# Patient Record
Sex: Female | Born: 1955 | Race: White | Hispanic: No | Marital: Married | Smoking: Never smoker
Health system: Southern US, Community
[De-identification: ages and names within clinical notes are randomized; demographics above are authoritative.]

## PROBLEM LIST (undated history)

## (undated) DIAGNOSIS — M1990 Unspecified osteoarthritis, unspecified site: Secondary | ICD-10-CM

## (undated) HISTORY — DX: Unspecified osteoarthritis, unspecified site: M19.90

---

## 1982-02-23 HISTORY — PX: DILATION AND CURETTAGE OF UTERUS: SHX78

## 1987-02-24 HISTORY — PX: TUBAL LIGATION: SHX77

## 1998-09-27 ENCOUNTER — Encounter: Payer: Self-pay | Admitting: Obstetrics and Gynecology

## 1998-09-27 ENCOUNTER — Ambulatory Visit (HOSPITAL_COMMUNITY): Admission: RE | Admit: 1998-09-27 | Discharge: 1998-09-27 | Payer: Self-pay | Admitting: Obstetrics and Gynecology

## 1999-09-29 ENCOUNTER — Encounter: Payer: Self-pay | Admitting: Obstetrics and Gynecology

## 1999-09-29 ENCOUNTER — Ambulatory Visit (HOSPITAL_COMMUNITY): Admission: RE | Admit: 1999-09-29 | Discharge: 1999-09-29 | Payer: Self-pay | Admitting: Obstetrics and Gynecology

## 2002-09-27 ENCOUNTER — Encounter: Admission: RE | Admit: 2002-09-27 | Discharge: 2002-09-27 | Payer: Self-pay | Admitting: Obstetrics and Gynecology

## 2002-09-27 ENCOUNTER — Encounter: Payer: Self-pay | Admitting: Obstetrics and Gynecology

## 2002-10-11 ENCOUNTER — Encounter: Admission: RE | Admit: 2002-10-11 | Discharge: 2002-10-11 | Payer: Self-pay | Admitting: Obstetrics and Gynecology

## 2002-10-11 ENCOUNTER — Encounter: Payer: Self-pay | Admitting: Obstetrics and Gynecology

## 2004-04-29 ENCOUNTER — Ambulatory Visit (HOSPITAL_COMMUNITY): Admission: RE | Admit: 2004-04-29 | Discharge: 2004-04-29 | Payer: Self-pay | Admitting: Family Medicine

## 2004-05-12 ENCOUNTER — Ambulatory Visit (HOSPITAL_COMMUNITY): Admission: RE | Admit: 2004-05-12 | Discharge: 2004-05-12 | Payer: Self-pay | Admitting: Family Medicine

## 2005-01-20 ENCOUNTER — Encounter: Admission: RE | Admit: 2005-01-20 | Discharge: 2005-01-20 | Payer: Self-pay | Admitting: Family Medicine

## 2005-05-04 ENCOUNTER — Encounter: Admission: RE | Admit: 2005-05-04 | Discharge: 2005-05-04 | Payer: Self-pay | Admitting: Family Medicine

## 2005-06-19 ENCOUNTER — Ambulatory Visit (HOSPITAL_COMMUNITY): Admission: RE | Admit: 2005-06-19 | Discharge: 2005-06-19 | Payer: Self-pay | Admitting: Obstetrics & Gynecology

## 2005-08-22 ENCOUNTER — Inpatient Hospital Stay (HOSPITAL_COMMUNITY): Admission: EM | Admit: 2005-08-22 | Discharge: 2005-08-23 | Payer: Self-pay | Admitting: *Deleted

## 2005-09-09 ENCOUNTER — Encounter: Admission: RE | Admit: 2005-09-09 | Discharge: 2005-09-09 | Payer: Self-pay | Admitting: Cardiology

## 2005-10-06 ENCOUNTER — Encounter: Admission: RE | Admit: 2005-10-06 | Discharge: 2005-10-06 | Payer: Self-pay | Admitting: Orthopaedic Surgery

## 2006-09-24 ENCOUNTER — Encounter: Admission: RE | Admit: 2006-09-24 | Discharge: 2006-09-24 | Payer: Self-pay | Admitting: Obstetrics and Gynecology

## 2007-04-06 IMAGING — US US ABDOMEN COMPLETE
1 series · 14 of 25 positions shown · non-contrast
Comparison: none

CLINICAL DATA: Abdominal discomfort.
 ABDOMEN ULTRASOUND:
TECHNIQUE: Complete abdominal ultrasound examination was performed including evaluation of the liver, gallbladder, bile ducts, pancreas, kidneys, spleen, IVC, and abdominal aorta.

[Series 1: us abdomen complete · 0.32mm/px · 14 of 69 slices shown]
[im 1/69]
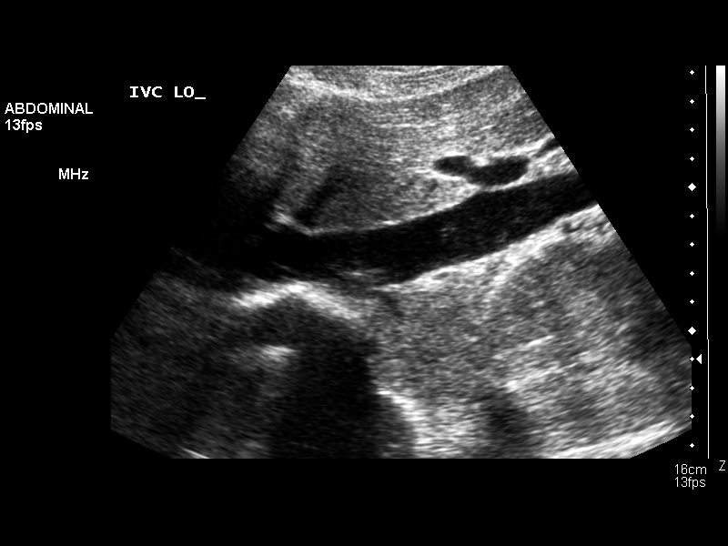
[im 6/69]
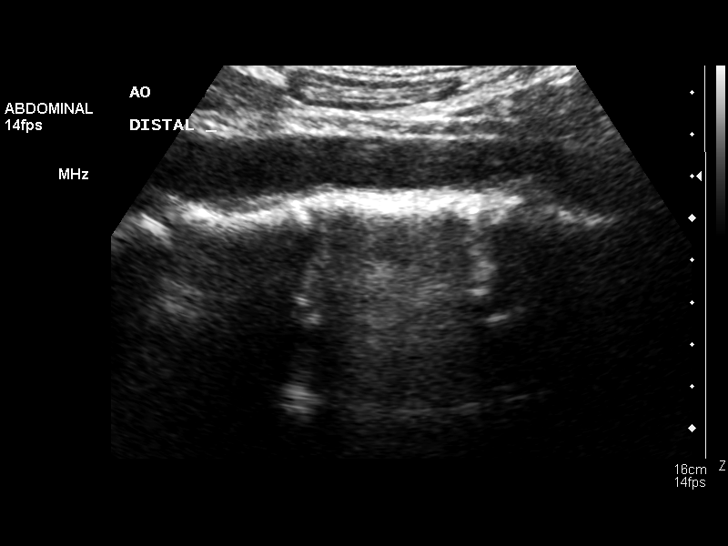
[im 12/69]
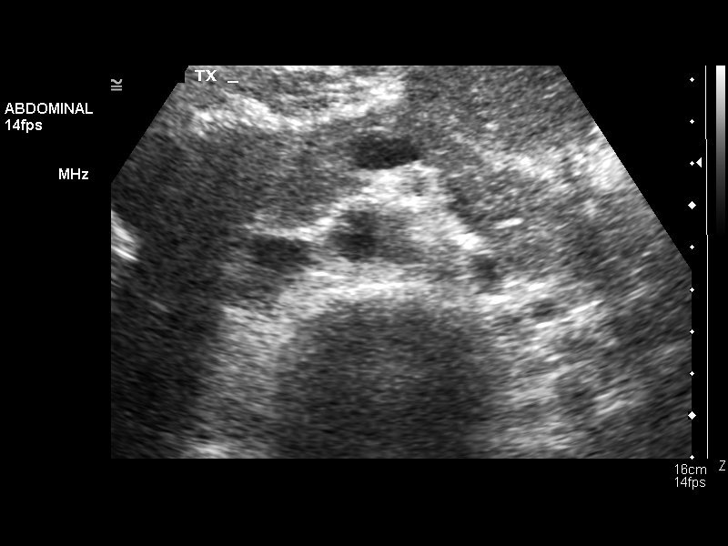
[im 18/69]
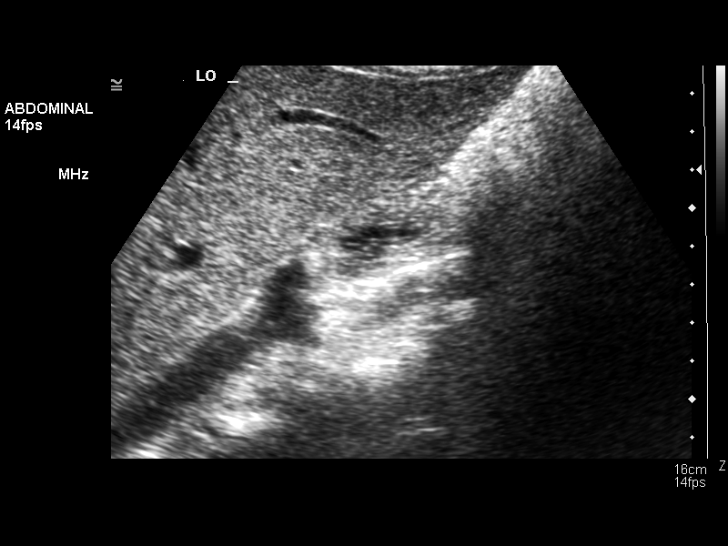
[im 23/69]
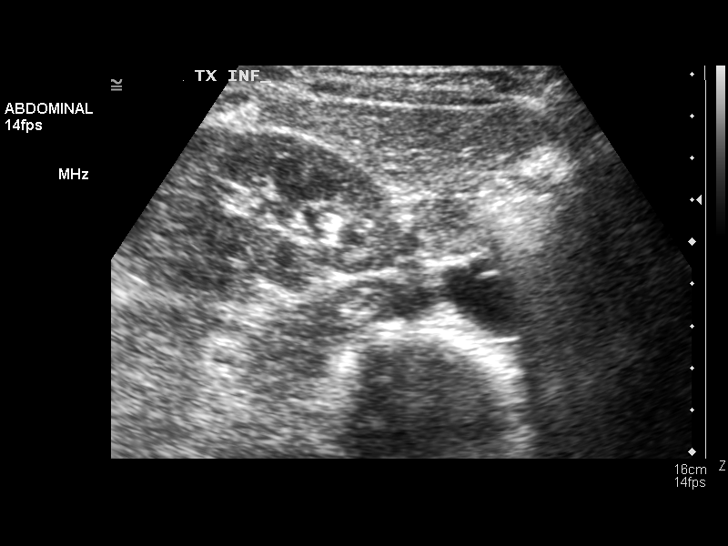
[im 26/69]
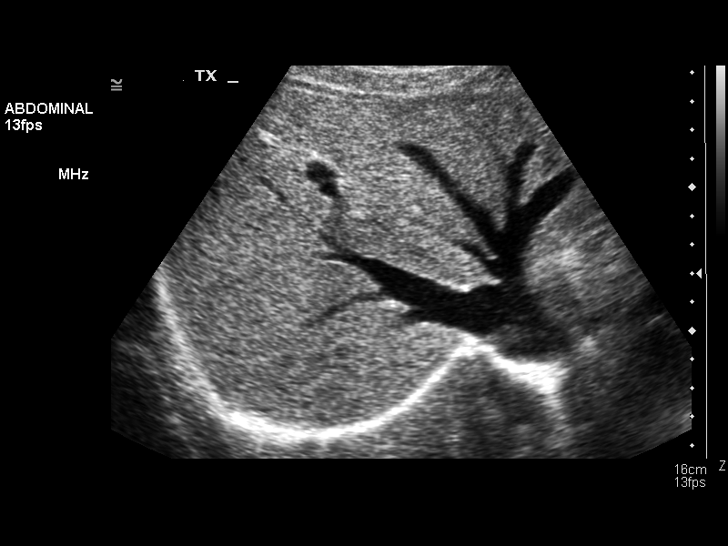
[im 32/69]
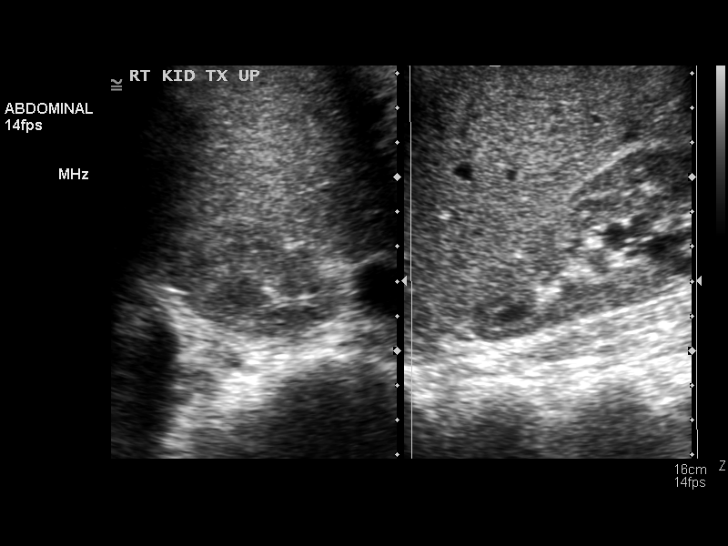
[im 37/69]
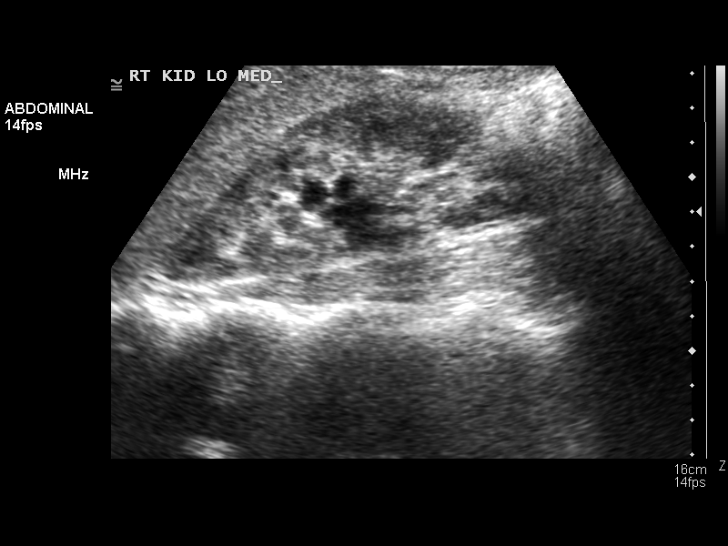
[im 43/69]
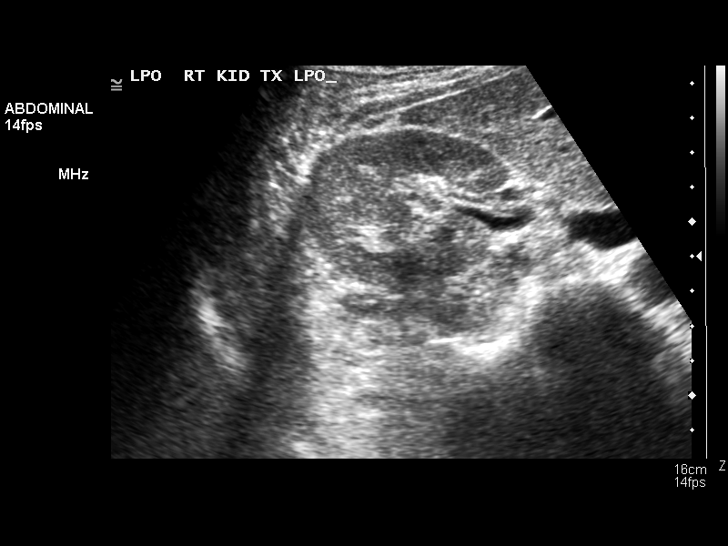
[im 46/69]
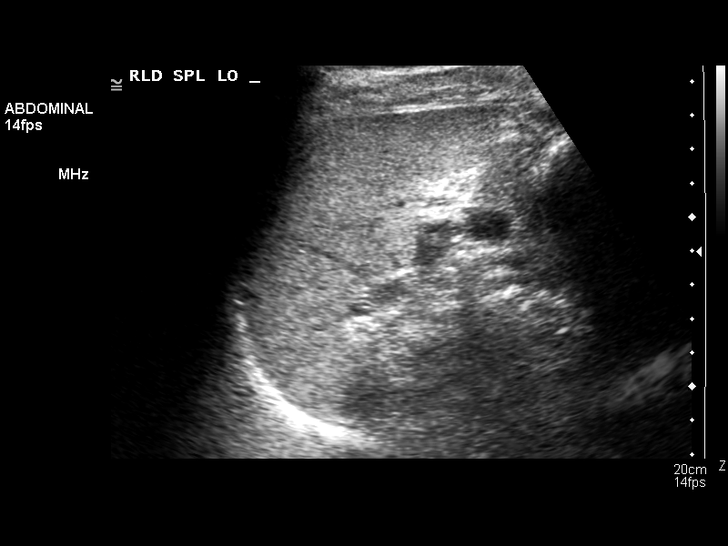
[im 52/69]
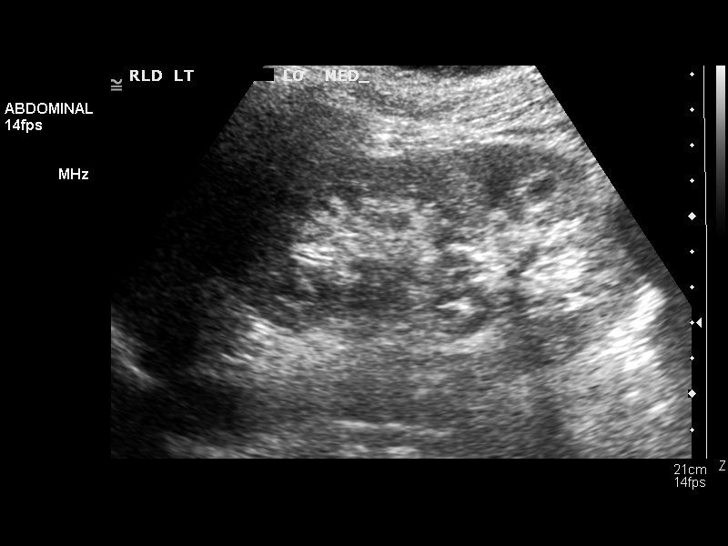
[im 57/69]
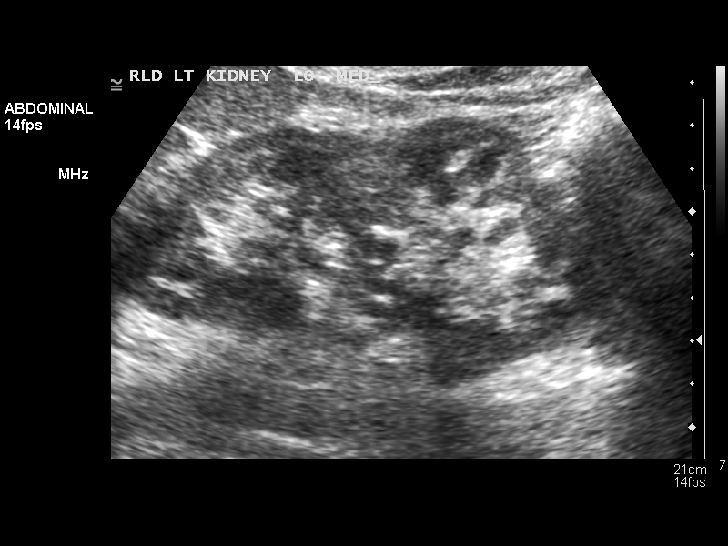
[im 63/69]
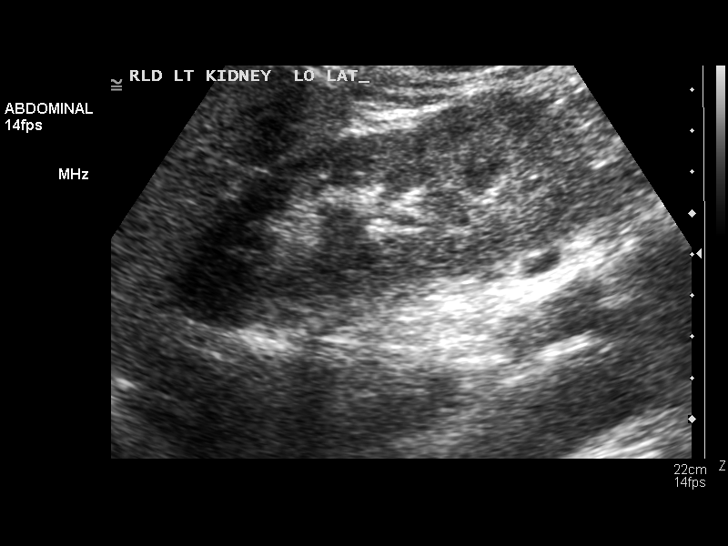
[im 69/69]
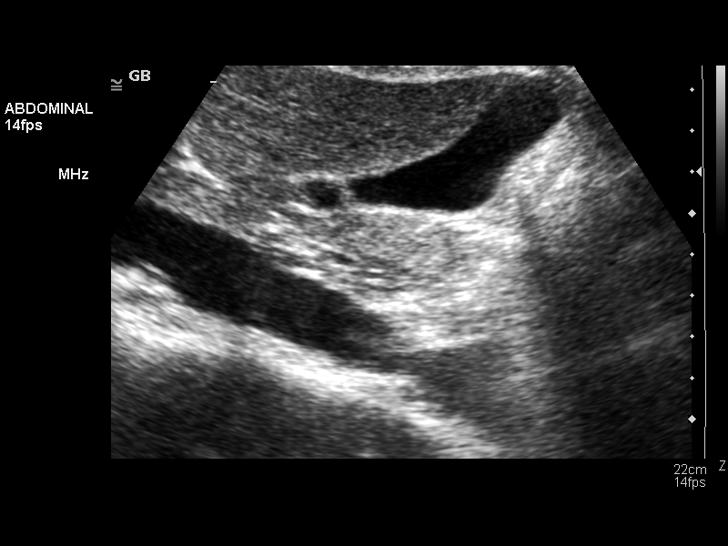

[14 of 25 positions shown; findings below may reference images not displayed]

FINDINGS: The gallbladder is well seen and no gallstones are noted.  The liver has a normal echogenic pattern.  Common bile duct is normal measuring 3.9 mm in diameter.  The IVC, pancreas, and spleen appear normal with only the most distal tail of the pancreas not well seen.  No hydronephrosis is noted.  The right kidney measures 12.1 cm sagittally with the left kidney measuring 11.8 cm.  The abdominal aorta is normal in caliber.
IMPRESSION: Negative abdominal ultrasound.  No gallstones.

## 2007-08-18 ENCOUNTER — Ambulatory Visit: Payer: Self-pay | Admitting: Internal Medicine

## 2007-09-06 ENCOUNTER — Ambulatory Visit: Payer: Self-pay | Admitting: Internal Medicine

## 2007-12-28 ENCOUNTER — Encounter: Admission: RE | Admit: 2007-12-28 | Discharge: 2007-12-28 | Payer: Self-pay | Admitting: Obstetrics and Gynecology

## 2009-02-26 ENCOUNTER — Encounter: Admission: RE | Admit: 2009-02-26 | Discharge: 2009-02-26 | Payer: Self-pay | Admitting: Obstetrics and Gynecology

## 2010-06-05 ENCOUNTER — Other Ambulatory Visit: Payer: Self-pay | Admitting: Obstetrics and Gynecology

## 2010-06-05 DIAGNOSIS — Z1231 Encounter for screening mammogram for malignant neoplasm of breast: Secondary | ICD-10-CM

## 2010-06-05 DIAGNOSIS — Z780 Asymptomatic menopausal state: Secondary | ICD-10-CM

## 2010-06-11 ENCOUNTER — Ambulatory Visit: Payer: Self-pay

## 2010-06-11 ENCOUNTER — Other Ambulatory Visit: Payer: Self-pay

## 2010-06-18 ENCOUNTER — Ambulatory Visit
Admission: RE | Admit: 2010-06-18 | Discharge: 2010-06-18 | Disposition: A | Discharge status: Home / Self Care | Payer: BC Managed Care – PPO | Source: Ambulatory Visit | Attending: Obstetrics and Gynecology | Admitting: Obstetrics and Gynecology

## 2010-06-18 DIAGNOSIS — Z1231 Encounter for screening mammogram for malignant neoplasm of breast: Secondary | ICD-10-CM

## 2010-06-18 DIAGNOSIS — Z780 Asymptomatic menopausal state: Secondary | ICD-10-CM

## 2010-07-11 NOTE — Discharge Summary (Signed)
Annette Stephens, Annette Stephens             ACCOUNT NO.:  [id number]   MEDICAL RECORD NO.:  [id number]          PATIENT TYPE:  INP   LOCATION:  3729                         FACILITY:  [facility]   PHYSICIAN:  Colleen Can. Deborah Chalk, M.D.DATE OF BIRTH:  [birth year]   DATE OF ADMISSION:  DATE OF DISCHARGE:  08/23/2005                                 DISCHARGE SUMMARY   PRIMARY DISCHARGE DIAGNOSIS:  Atypical chest pain with negative cardiac  enzymes.   HISTORY OF PRESENT ILLNESS:  The patient is a very pleasant 55 year old  female who presented to the Emergency Department on 08/22/2005 with an  episode of chest pressure that was somewhat intermittent.  It responded  promptly to nitroglycerin.  She had no other associated symptoms.  She was  subsequently seen in the Emergency Room and was admitted for overnight  evaluation.   Please see the dictated History and Physical for further patient  presentation and profile.   LABORATORY DATA:  CBC normal.  Chemistries were normal.  Total cholesterol  193, LDL 105, HDL 82 and triglycerides 29.  EKG was unremarkable.  All  cardiac enzymes were negative.   HOSPITAL COURSE:  The patient was admitted electively.  She was watched  overnight on telemetry. She was placed on subcutaneous Lovenox. She ruled  out, negative for myocardial infarction and had no recurrence of chest pain.  Today on August 23, 2005 she is doing well without complaints and is felt to be  a satisfactory candidate for discharge.   DISCHARGE CONDITION:  Stable.   DISCHARGE MEDICATIONS:  1.  Aspirin daily.  2.  Protonix 40 mg daily.  3.  Nitroglycerin p.r.n.   DISCHARGE INSTRUCTIONS:  Her activity is to be as tolerated.   PLAN:  Will plan on setting her up for an outpatient stress Cardiolite.  Our  office will be calling her with full instructions on Monday morning, August 24, 2005.  She is to call if any problems arise in the interim.      Sharlee Blew, N.P.      Colleen Can. Deborah Chalk,  M.D.  Electronically Signed    LC/MEDQ  D:  08/23/2005  T:  08/23/2005  Job:  [id number]   cc:   Teena Irani. Arlyce Dice, M.D.  Fax: [phone]

## 2010-07-11 NOTE — H&P (Signed)
Annette Stephens, Annette Stephens             ACCOUNT NO.:  [id number]   MEDICAL RECORD NO.:  [id number]          PATIENT TYPE:  EMS   LOCATION:  MINO                         FACILITY:  [facility]   PHYSICIAN:  Colleen Can. Deborah Chalk, M.D.DATE OF BIRTH:  February 07, 1956   DATE OF ADMISSION:  08/22/2005  DATE OF DISCHARGE:                                HISTORY & PHYSICAL   CHIEF COMPLAINT:  Chest pain.   HISTORY OF PRESENT ILLNESS:  The patient is a very pleasant, 55 year old,  who really has no significant past medical history.  She presents to the  emergency department today per EMS with an episode of chest pressure.  She  had been out walking about 45 minutes earlier this morning without problems.  At around 10 a.m., she was out on her deck watering her flowers when she had  the onset of chest pressure that was located substernally.  There were no  other associated symptoms.  No radiation, no nausea, vomiting, or shortness  of breath.  She called EMS within 10 minutes after the onset.  She noticed  the pain was more like a pressure sensation as if someone were pressing into  her chest and then letting up.  She was given nitroglycerin and aspirin in  route with complete relief.  Her EKG was unremarkable.  Her blood pressure  at the time the fire department arrived was 150 systolic, which is unusual.  She is currently pain free.   PAST MEDICAL HISTORY:  1.  Past history of an abnormal EKG found on physical exam with a negative      stress echocardiogram in February of 2005.  2.  Status post tubal ligation.   She has had no other surgeries and no other health problems.  Her  cholesterol levels are unknown.   ALLERGIES:  None.   MEDICATIONS:  None.   FAMILY HISTORY:  Her father is alive at age 44.  He has had previous heart  attacks as well as peripheral vascular disease starting in his early 28s.  Her mother is alive at 49 with hypertension.  She has one brother, who is an  alcoholic as well as a  smoker, who had a heart attack in his early 4s.  She  has two sisters.   SOCIAL HISTORY:  She is married, she has two children, she has no tobacco  use and very rare alcohol use.   REVIEW OF SYSTEMS:  She had previously been doing well without complaints.  She attended a wedding last night, had one glass of red wine.  She did fall  just a couple of days ago and sprained her left wrist.  She does exercise  regularly.  She has had no previous episodes of chest pain or shortness of  breath.  She has had no problems with lightheadedness, dizziness, fever,  flu, cough, indigestion, constipation, diarrhea.   PHYSICAL EXAMINATION:  GENERAL:  She is in no acute distress.  She is pain  free.  VITAL SIGNS:  Blood pressure is 110/72, heart rate 77, respirations are 20,  she is afebrile.  SKIN:  Warm and dry,  and color is unremarkable.  LUNGS:  Clear.  HEART:  Regular rhythm.  ABDOMEN:  Soft with positive bowel sounds, nontender.  EXTREMITIES:  Without edema.  She does have a splint to the left wrist.  NEUROLOGIC:  Intact, and there are no gross focal deficits.   PERTINENT LABORATORY DATA:  Her chemistries are normal, CBC is normal,  cardiac markers are negative x2, EKG is negative.   FINAL IMPRESSION:  1.  Chest pain that was responsive to nitroglycerin.  2.  Positive family history.  3.  Unknown cholesterol level.   PLAN:  She will be admitted for overnight, will check serial labs.  Probably  if enzymes and EKG remain unremarkable, we will set her up for a repeat  stress echo as an outpatient.      Sharlee Blew, N.P.    ______________________________  Colleen Can. Deborah Chalk, M.D.    LC/MEDQ  D:  08/22/2005  T:  08/22/2005  Job:  [id number]   cc:   Teena Irani. Arlyce Dice, M.D.  Fax: [phone]

## 2011-09-21 ENCOUNTER — Other Ambulatory Visit: Payer: Self-pay | Admitting: Family Medicine

## 2011-09-21 DIAGNOSIS — Z1231 Encounter for screening mammogram for malignant neoplasm of breast: Secondary | ICD-10-CM

## 2011-09-23 ENCOUNTER — Ambulatory Visit
Admission: RE | Admit: 2011-09-23 | Discharge: 2011-09-23 | Disposition: A | Discharge status: Home / Self Care | Payer: BC Managed Care – PPO | Source: Ambulatory Visit | Attending: Family Medicine | Admitting: Family Medicine

## 2011-09-23 DIAGNOSIS — Z1231 Encounter for screening mammogram for malignant neoplasm of breast: Secondary | ICD-10-CM

## 2012-06-22 ENCOUNTER — Ambulatory Visit: Payer: BC Managed Care – PPO | Admitting: Family Medicine

## 2012-12-19 ENCOUNTER — Other Ambulatory Visit: Payer: Self-pay

## 2012-12-19 DIAGNOSIS — Z1231 Encounter for screening mammogram for malignant neoplasm of breast: Secondary | ICD-10-CM

## 2013-01-16 ENCOUNTER — Ambulatory Visit
Admission: RE | Admit: 2013-01-16 | Discharge: 2013-01-16 | Disposition: A | Discharge status: Home / Self Care | Payer: BC Managed Care – PPO | Source: Ambulatory Visit

## 2013-01-16 DIAGNOSIS — Z1231 Encounter for screening mammogram for malignant neoplasm of breast: Secondary | ICD-10-CM

## 2013-12-19 ENCOUNTER — Other Ambulatory Visit: Payer: Self-pay | Admitting: Family Medicine

## 2013-12-19 DIAGNOSIS — Z1231 Encounter for screening mammogram for malignant neoplasm of breast: Secondary | ICD-10-CM

## 2014-01-22 ENCOUNTER — Ambulatory Visit: Payer: BC Managed Care – PPO

## 2014-01-22 ENCOUNTER — Ambulatory Visit
Admission: RE | Admit: 2014-01-22 | Discharge: 2014-01-22 | Disposition: A | Discharge status: Home / Self Care | Payer: BC Managed Care – PPO | Source: Ambulatory Visit | Attending: Family Medicine | Admitting: Family Medicine

## 2014-01-22 DIAGNOSIS — Z1231 Encounter for screening mammogram for malignant neoplasm of breast: Secondary | ICD-10-CM

## 2014-12-19 ENCOUNTER — Other Ambulatory Visit: Payer: Self-pay

## 2014-12-19 DIAGNOSIS — Z1231 Encounter for screening mammogram for malignant neoplasm of breast: Secondary | ICD-10-CM

## 2015-01-25 ENCOUNTER — Ambulatory Visit
Admission: RE | Admit: 2015-01-25 | Discharge: 2015-01-25 | Disposition: A | Discharge status: Home / Self Care | Payer: BC Managed Care – PPO | Source: Ambulatory Visit

## 2015-01-25 DIAGNOSIS — Z1231 Encounter for screening mammogram for malignant neoplasm of breast: Secondary | ICD-10-CM

## 2015-05-14 ENCOUNTER — Encounter: Payer: Self-pay | Admitting: Internal Medicine

## 2016-02-13 ENCOUNTER — Other Ambulatory Visit: Payer: Self-pay | Admitting: Family Medicine

## 2016-02-13 DIAGNOSIS — Z1231 Encounter for screening mammogram for malignant neoplasm of breast: Secondary | ICD-10-CM

## 2016-03-13 ENCOUNTER — Ambulatory Visit
Admission: RE | Admit: 2016-03-13 | Discharge: 2016-03-13 | Disposition: A | Discharge status: Home / Self Care | Payer: BC Managed Care – PPO | Source: Ambulatory Visit | Attending: Family Medicine | Admitting: Family Medicine

## 2016-03-13 DIAGNOSIS — Z1231 Encounter for screening mammogram for malignant neoplasm of breast: Secondary | ICD-10-CM

## 2016-07-15 ENCOUNTER — Ambulatory Visit (INDEPENDENT_AMBULATORY_CARE_PROVIDER_SITE_OTHER): Payer: BC Managed Care – PPO | Admitting: Orthopaedic Surgery

## 2016-07-15 ENCOUNTER — Encounter (INDEPENDENT_AMBULATORY_CARE_PROVIDER_SITE_OTHER): Payer: Self-pay

## 2016-07-15 DIAGNOSIS — M67449 Ganglion, unspecified hand: Secondary | ICD-10-CM

## 2016-07-15 MED ORDER — DICLOFENAC SODIUM 1 % TD GEL: 2.0000 g | Freq: Four times a day (QID) | TRANSDERMAL | 3 refills | Status: DC

## 2016-07-15 NOTE — Progress Notes (Signed)
The patient is well-known to me. She comes in today with new chief complaint of pain in the cyst along her right hand middle finger. She points over the DIP joint. New Parafon exam she does have findings consistent with a mucous cyst. DIP joint moves well but she can feel a dorsal bone spur as well. There is cystic changes on the soft tissue. I was able to clean sterile Betadine and alcohol and with a small gauge needle I was able to aspirate some gelatinous material from this. I will Have Her Pl., Neosporin on a daily. I've told her the other treatment would be actually an open operation to remove the bone spur. This is bad enough she said she will let us know. She'll otherwise follow up as needed.

## 2017-03-16 ENCOUNTER — Other Ambulatory Visit: Payer: Self-pay | Admitting: Family Medicine

## 2017-03-16 DIAGNOSIS — Z139 Encounter for screening, unspecified: Secondary | ICD-10-CM

## 2017-04-06 ENCOUNTER — Ambulatory Visit
Admission: RE | Admit: 2017-04-06 | Discharge: 2017-04-06 | Disposition: A | Discharge status: Home / Self Care | Payer: BC Managed Care – PPO | Source: Ambulatory Visit | Attending: Family Medicine | Admitting: Family Medicine

## 2017-04-06 DIAGNOSIS — Z139 Encounter for screening, unspecified: Secondary | ICD-10-CM

## 2017-07-13 ENCOUNTER — Encounter: Payer: Self-pay | Admitting: Gastroenterology

## 2017-07-14 ENCOUNTER — Other Ambulatory Visit: Payer: Self-pay | Admitting: Family Medicine

## 2017-07-14 DIAGNOSIS — E2839 Other primary ovarian failure: Secondary | ICD-10-CM

## 2017-08-03 ENCOUNTER — Other Ambulatory Visit: Payer: Self-pay | Admitting: Family Medicine

## 2017-08-03 DIAGNOSIS — R1031 Right lower quadrant pain: Secondary | ICD-10-CM

## 2017-08-06 ENCOUNTER — Ambulatory Visit
Admission: RE | Admit: 2017-08-06 | Discharge: 2017-08-06 | Disposition: A | Discharge status: Home / Self Care | Payer: BC Managed Care – PPO | Source: Ambulatory Visit | Attending: Family Medicine | Admitting: Family Medicine

## 2017-08-06 DIAGNOSIS — R1031 Right lower quadrant pain: Secondary | ICD-10-CM

## 2017-08-10 ENCOUNTER — Ambulatory Visit (INDEPENDENT_AMBULATORY_CARE_PROVIDER_SITE_OTHER): Payer: Self-pay

## 2017-08-10 ENCOUNTER — Ambulatory Visit (INDEPENDENT_AMBULATORY_CARE_PROVIDER_SITE_OTHER): Payer: BC Managed Care – PPO | Admitting: Orthopaedic Surgery

## 2017-08-10 DIAGNOSIS — M545 Low back pain, unspecified: Secondary | ICD-10-CM

## 2017-08-10 MED ORDER — MELOXICAM 7.5 MG PO TABS: 15.0000 mg | ORAL_TABLET | Freq: Every day | ORAL | 2 refills | Status: DC | PRN

## 2017-08-10 MED ORDER — CYCLOBENZAPRINE HCL 5 MG PO TABS: 5.0000 mg | ORAL_TABLET | Freq: Three times a day (TID) | ORAL | 3 refills | Status: DC | PRN

## 2017-08-10 MED ORDER — METHOCARBAMOL 500 MG PO TABS: 500.0000 mg | ORAL_TABLET | Freq: Four times a day (QID) | ORAL | 2 refills | Status: DC | PRN

## 2017-08-10 NOTE — Progress Notes (Signed)
Office Visit Note   Patient: Annette Stephens           Date of Birth: 1955-07-11           MRN: [id number] Visit Date: 08/10/2017              Requested by: Richmond CampbellKaplan, Kristen W., PA-C [address] [place], [place] [id number] PCP: Richmond CampbellKaplan, Kristen W., PA-C   Assessment & Plan: Visit Diagnoses:  1. Acute midline low back pain without sciatica     Plan: Impression is low back pain and muscle strain.  Recommend physical therapy, Robaxin, meloxicam, Flexeril as needed.  Patient instructed to notify us if she does not see any improvement so that we could obtain it.  Otherwise follow-up as needed.  Follow-Up Instructions: Return if symptoms worsen or fail to improve.   Orders:  Orders Placed This Encounter  Procedures  . XR Lumbar Spine 2-3 Views   Meds ordered this encounter  Medications  . methocarbamol (ROBAXIN) 500 MG tablet    Sig: Take 1 tablet (500 mg total) by mouth every 6 (six) hours as needed for muscle spasms.    Dispense:  30 tablet    Refill:  2    Try this muscle relaxer first  . meloxicam (MOBIC) 7.5 MG tablet    Sig: Take 2 tablets (15 mg total) by mouth daily as needed for pain.    Dispense:  30 tablet    Refill:  2  . cyclobenzaprine (FLEXERIL) 5 MG tablet    Sig: Take 1-2 tablets (5-10 mg total) by mouth 3 (three) times daily as needed for muscle spasms.    Dispense:  30 tablet    Refill:  3      Procedures: No procedures performed   Clinical Data: No additional findings.   Subjective: Chief Complaint  Patient presents with  . Lower Back - Pain    Annette Stephens is a very pleasant 62 year old female comes in with 2-week history of low back pain without any radicular symptoms.  Denies any shortness.  Denies any constitutional symptoms.  Denies any injuries.  She has taken gabapentin and tizanidine without any relief.   Review of Systems  Constitutional: Negative.   HENT: Negative.   Eyes: Negative.   Respiratory: Negative.   Cardiovascular:  Negative.   Endocrine: Negative.   Musculoskeletal: Negative.   Neurological: Negative.   Hematological: Negative.   Psychiatric/Behavioral: Negative.   All other systems reviewed and are negative.    Objective: Vital Signs: There were no vitals taken for this visit.  Physical Exam  Constitutional: She is oriented to person, place, and time. She appears well-developed and well-nourished.  HENT:  Head: Normocephalic and atraumatic.  Eyes: EOM are normal.  Neck: Neck supple.  Pulmonary/Chest: Effort normal.  Abdominal: Soft.  Neurological: She is alert and oriented to person, place, and time.  Skin: Skin is warm. Capillary refill takes less than 2 seconds.  Psychiatric: She has a normal mood and affect. Her behavior is normal. Judgment and thought content normal.  Nursing note and vitals reviewed.   Ortho Exam Low back exam shows no palpable defects.  Negative straight leg.  Hip exam asymptomatic.  Normal reflexes. Specialty Comments:  No specialty comments available.  Imaging: Xr Lumbar Spine 2-3 Views  Result Date: 08/10/2017 Mild lumbar spondylosis.  No significant degenerative disc disease.Normal lumbar lordosis.      PMFS History: Patient Active Problem List   Diagnosis Date Noted  . Mucous cyst  of finger 07/15/2016   No past medical history on file.  No family history on file.   Social History   Occupational History  . Not on file  Tobacco Use  . Smoking status: Not on file  Substance and Sexual Activity  . Alcohol use: Not on file  . Drug use: Not on file  . Sexual activity: Not on file

## 2017-08-12 ENCOUNTER — Encounter: Payer: Self-pay | Admitting: Gastroenterology

## 2017-08-12 NOTE — Addendum Note (Signed)
Addended by: Albertina ParrGARCIA, Earley Grobe on: 08/12/2017 08:39 AM   Modules accepted: Orders

## 2017-08-23 ENCOUNTER — Other Ambulatory Visit: Payer: Self-pay | Admitting: Family Medicine

## 2017-08-23 DIAGNOSIS — R102 Pelvic and perineal pain: Secondary | ICD-10-CM

## 2017-08-27 ENCOUNTER — Ambulatory Visit
Admission: RE | Admit: 2017-08-27 | Discharge: 2017-08-27 | Disposition: A | Discharge status: Home / Self Care | Payer: BC Managed Care – PPO | Source: Ambulatory Visit | Attending: Family Medicine | Admitting: Family Medicine

## 2017-08-27 DIAGNOSIS — E2839 Other primary ovarian failure: Secondary | ICD-10-CM

## 2017-08-31 ENCOUNTER — Ambulatory Visit
Admission: RE | Admit: 2017-08-31 | Discharge: 2017-08-31 | Disposition: A | Discharge status: Home / Self Care | Payer: BC Managed Care – PPO | Source: Ambulatory Visit | Attending: Family Medicine | Admitting: Family Medicine

## 2017-08-31 DIAGNOSIS — R102 Pelvic and perineal pain: Secondary | ICD-10-CM

## 2017-10-07 ENCOUNTER — Encounter: Payer: BC Managed Care – PPO | Admitting: Gastroenterology

## 2017-12-15 ENCOUNTER — Encounter: Payer: Self-pay | Admitting: Gastroenterology

## 2018-01-10 ENCOUNTER — Encounter: Payer: BC Managed Care – PPO | Admitting: Gastroenterology

## 2018-01-25 ENCOUNTER — Ambulatory Visit (AMBULATORY_SURGERY_CENTER): Payer: Self-pay

## 2018-01-25 ENCOUNTER — Other Ambulatory Visit: Payer: Self-pay

## 2018-01-25 VITALS — Ht 62.0 in | Wt 127.8 lb

## 2018-01-25 DIAGNOSIS — Z1211 Encounter for screening for malignant neoplasm of colon: Secondary | ICD-10-CM

## 2018-01-25 MED ORDER — NA SULFATE-K SULFATE-MG SULF 17.5-3.13-1.6 GM/177ML PO SOLN: 1.0000 | Freq: Once | ORAL | 0 refills | Status: AC

## 2018-01-25 NOTE — Progress Notes (Signed)
No egg or soy allergy known to patient  No issues with past sedation with any surgeries  or procedures, no intubation problems  No diet pills per patient No home 02 use per patient  No blood thinners per patient  Pt denies issues with constipation  No A fib or A flutter  EMMI video sent to pt's e mail  

## 2018-01-26 ENCOUNTER — Encounter: Payer: Self-pay | Admitting: Gastroenterology

## 2018-02-08 ENCOUNTER — Encounter: Payer: Self-pay | Admitting: Gastroenterology

## 2018-02-08 ENCOUNTER — Ambulatory Visit (AMBULATORY_SURGERY_CENTER): Payer: BC Managed Care – PPO | Admitting: Gastroenterology

## 2018-02-08 VITALS — BP 125/62 | HR 52 | Temp 98.0°F | Resp 15 | Ht 62.0 in | Wt 127.0 lb

## 2018-02-08 DIAGNOSIS — Z1211 Encounter for screening for malignant neoplasm of colon: Secondary | ICD-10-CM | POA: Diagnosis present

## 2018-02-08 MED ORDER — SODIUM CHLORIDE 0.9 % IV SOLN: 500.0000 mL | Freq: Once | INTRAVENOUS | Status: DC

## 2018-02-08 NOTE — Patient Instructions (Signed)
   Continue usual diet and medications   Information on hemorrhoids given to you today   YOU HAD AN ENDOSCOPIC PROCEDURE TODAY AT THE [facility]:   Refer to the procedure report that was given to you for any specific questions about what was found during the examination.  If the procedure report does not answer your questions, please call your gastroenterologist to clarify.  If you requested that your care partner not be given the details of your procedure findings, then the procedure report has been included in a sealed envelope for you to review at your convenience later.  YOU SHOULD EXPECT: Some feelings of bloating in the abdomen. Passage of more gas than usual.  Walking can help get rid of the air that was put into your GI tract during the procedure and reduce the bloating. If you had a lower endoscopy (such as a colonoscopy or flexible sigmoidoscopy) you may notice spotting of blood in your stool or on the toilet paper. If you underwent a bowel prep for your procedure, you may not have a normal bowel movement for a few days.  Please Note:  You might notice some irritation and congestion in your nose or some drainage.  This is from the oxygen used during your procedure.  There is no need for concern and it should clear up in a day or so.  SYMPTOMS TO REPORT IMMEDIATELY:   Following lower endoscopy (colonoscopy or flexible sigmoidoscopy):  Excessive amounts of blood in the stool  Significant tenderness or worsening of abdominal pains  Swelling of the abdomen that is new, acute  Fever of 100F or higher    For urgent or emergent issues, a gastroenterologist can be reached at any hour by calling [phone].   DIET:  We do recommend a small meal at first, but then you may proceed to your regular diet.  Drink plenty of fluids but you should avoid alcoholic beverages for 24 hours.  ACTIVITY:  You should plan to take it easy for the rest of today and you should NOT DRIVE  or use heavy machinery until tomorrow (because of the sedation medicines used during the test).    FOLLOW UP: Our staff will call the number listed on your records the next business day following your procedure to check on you and address any questions or concerns that you may have regarding the information given to you following your procedure. If we do not reach you, we will leave a message.  However, if you are feeling well and you are not experiencing any problems, there is no need to return our call.  We will assume that you have returned to your regular daily activities without incident.  If any biopsies were taken you will be contacted by phone or by letter within the next 1-3 weeks.  Please call us at [phone] if you have not heard about the biopsies in 3 weeks.    SIGNATURES/CONFIDENTIALITY: You and/or your care partner have signed paperwork which will be entered into your electronic medical record.  These signatures attest to the fact that that the information above on your After Visit Summary has been reviewed and is understood.  Full responsibility of the confidentiality of this discharge information lies with you and/or your care-partner.

## 2018-02-08 NOTE — Op Note (Signed)
[facility] Patient Name: Annette Stephens Procedure Date: 02/08/2018 8:02 AM MRN: [id number] Endoscopist: Napoleon Form , MD Age: 62 Referring MD:  Date of Birth: April 27, 1955 Gender: Female Account #: [id number] Procedure:                Colonoscopy Indications:              Screening for colorectal malignant neoplasm, Last                            colonoscopy: 2009 Medicines:                Monitored Anesthesia Care Procedure:                Pre-Anesthesia Assessment:                           - Prior to the procedure, a History and Physical                            was performed, and patient medications and                            allergies were reviewed. The patient's tolerance of                            previous anesthesia was also reviewed. The risks                            and benefits of the procedure and the sedation                            options and risks were discussed with the patient.                            All questions were answered, and informed consent                            was obtained. Prior Anticoagulants: The patient has                            taken no previous anticoagulant or antiplatelet                            agents. ASA Grade Assessment: I - A normal, healthy                            patient. After reviewing the risks and benefits,                            the patient was deemed in satisfactory condition to                            undergo the procedure.  After obtaining informed consent, the colonoscope                            was passed under direct vision. Throughout the                            procedure, the patient's blood pressure, pulse, and                            oxygen saturations were monitored continuously. The                            Colonoscope was introduced through the anus and                            advanced to the the cecum, identified by                        appendiceal orifice and ileocecal valve. The                            colonoscopy was performed without difficulty. The                            patient tolerated the procedure well. The quality                            of the bowel preparation was excellent. The                            ileocecal valve, appendiceal orifice, and rectum                            were photographed. Scope In: 8:09:20 AM Scope Out: 8:33:11 AM Scope Withdrawal Time: 0 hours 10 minutes 54 seconds  Total Procedure Duration: 0 hours 23 minutes 51 seconds  Findings:                 The perianal and digital rectal examinations were                            normal.                           Non-bleeding internal hemorrhoids were found during                            retroflexion. The hemorrhoids were small.                           The exam was otherwise without abnormality. Complications:            No immediate complications. Estimated Blood Loss:     Estimated blood loss: none. Impression:               - Non-bleeding internal hemorrhoids.                           -  The examination was otherwise normal.                           - No specimens collected. Recommendation:           - Patient has a contact number available for                            emergencies. The signs and symptoms of potential                            delayed complications were discussed with the                            patient. Return to normal activities tomorrow.                            Written discharge instructions were provided to the                            patient.                           - Resume previous diet.                           - Continue present medications.                           - Repeat colonoscopy in 10 years for screening                            purposes. Napoleon Form, MD 02/08/2018 8:37:30 AM This report has been signed electronically.

## 2018-02-08 NOTE — Progress Notes (Signed)
Pt's states no medical or surgical changes since previsit or office visit. 

## 2018-02-08 NOTE — Progress Notes (Signed)
Report to PACU, RN, vss, BBS= Clear.  

## 2018-02-09 ENCOUNTER — Telehealth: Payer: Self-pay

## 2018-02-09 NOTE — Telephone Encounter (Signed)
  Follow up Call-  Call back number 02/08/2018  Post procedure Call Back phone  # [phone]  Permission to leave phone message Yes  Some recent data might be hidden     Patient questions:  Do you have a fever, pain , or abdominal swelling? No. Pain Score  0 *  Have you tolerated food without any problems? Yes.    Have you been able to return to your normal activities? Yes.    Do you have any questions about your discharge instructions: Diet   No. Medications  No. Follow up visit  No.  Do you have questions or concerns about your Care? No.  Actions: * If pain score is 4 or above: No action needed, pain <4.  No problems noted per pt. maw

## 2018-02-09 NOTE — Telephone Encounter (Signed)
Left message on follow up call today. 

## 2018-03-29 ENCOUNTER — Other Ambulatory Visit: Payer: Self-pay | Admitting: Family Medicine

## 2018-03-29 DIAGNOSIS — Z1231 Encounter for screening mammogram for malignant neoplasm of breast: Secondary | ICD-10-CM

## 2018-04-13 ENCOUNTER — Other Ambulatory Visit: Payer: Self-pay | Admitting: Podiatry

## 2018-04-13 ENCOUNTER — Ambulatory Visit (INDEPENDENT_AMBULATORY_CARE_PROVIDER_SITE_OTHER): Payer: BC Managed Care – PPO

## 2018-04-13 ENCOUNTER — Ambulatory Visit: Payer: BC Managed Care – PPO | Admitting: Podiatry

## 2018-04-13 ENCOUNTER — Encounter: Payer: Self-pay | Admitting: Podiatry

## 2018-04-13 VITALS — BP 101/50

## 2018-04-13 DIAGNOSIS — M2012 Hallux valgus (acquired), left foot: Secondary | ICD-10-CM | POA: Diagnosis not present

## 2018-04-13 DIAGNOSIS — M2011 Hallux valgus (acquired), right foot: Secondary | ICD-10-CM | POA: Diagnosis not present

## 2018-04-13 DIAGNOSIS — M79671 Pain in right foot: Secondary | ICD-10-CM

## 2018-04-13 DIAGNOSIS — M779 Enthesopathy, unspecified: Secondary | ICD-10-CM

## 2018-04-13 DIAGNOSIS — M21619 Bunion of unspecified foot: Secondary | ICD-10-CM | POA: Diagnosis not present

## 2018-04-13 DIAGNOSIS — M79672 Pain in left foot: Secondary | ICD-10-CM

## 2018-04-13 DIAGNOSIS — M205X1 Other deformities of toe(s) (acquired), right foot: Secondary | ICD-10-CM | POA: Diagnosis not present

## 2018-04-13 DIAGNOSIS — M7751 Other enthesopathy of right foot: Secondary | ICD-10-CM | POA: Diagnosis not present

## 2018-04-13 MED ORDER — TRIAMCINOLONE ACETONIDE 10 MG/ML IJ SUSP
10.0000 mg | Freq: Once | INTRAMUSCULAR | Status: AC
  Administered 2018-04-13: 10 mg

## 2018-04-13 NOTE — Patient Instructions (Addendum)
Bunion  A bunion is a bump on the base of the big toe that forms when the bones of the big toe joint move out of position. Bunions may be small at first, but they often get larger over time. They can make walking painful. What are the causes? A bunion may be caused by:  Wearing narrow or pointed shoes that force the big toe to press against the other toes.  Abnormal foot development that causes the foot to roll inward (pronate).  Changes in the foot that are caused by certain diseases, such as rheumatoid arthritis or polio.  A foot injury. What increases the risk? The following factors may make you more likely to develop this condition:  Wearing shoes that squeeze the toes together.  Having certain diseases, such as: ? Rheumatoid arthritis. ? Polio. ? Cerebral palsy.  Having family members who have bunions.  Being born with a foot deformity, such as flat feet or low arches.  Doing activities that put a lot of pressure on the feet, such as ballet dancing. What are the signs or symptoms? The main symptom of a bunion is a noticeable bump on the big toe. Other symptoms may include:  Pain.  Swelling around the big toe.  Redness and inflammation.  Thick or hardened skin on the big toe or between the toes.  Stiffness or loss of motion in the big toe.  Trouble with walking. How is this diagnosed? A bunion may be diagnosed based on your symptoms, medical history, and activities. You may have tests, such as:  X-rays. These allow your health care provider to check the position of the bones in your foot and look for damage to your joint. They also help your health care provider determine the severity of your bunion and the best way to treat it.  Joint aspiration. In this test, a sample of fluid is removed from the toe joint. This test may be done if you are in a lot of pain. It helps rule out diseases that cause painful swelling of the joints, such as arthritis. How is this  treated? Treatment depends on the severity of your symptoms. The goal of treatment is to relieve symptoms and prevent the bunion from getting worse. Your health care provider may recommend:  Wearing shoes that have a wide toe box.  Using bunion pads to cushion the affected area.  Taping your toes together to keep them in a normal position.  Placing a device inside your shoe (orthotics) to help reduce pressure on your toe joint.  Taking medicine to ease pain, inflammation, and swelling.  Applying heat or ice to the affected area.  Doing stretching exercises.  Surgery to remove scar tissue and move the toes back into their normal position. This treatment is rare. Follow these instructions at home: Managing pain, stiffness, and swelling   If directed, put ice on the painful area: ? Put ice in a plastic bag. ? Place a towel between your skin and the bag. ? Leave the ice on for 20 minutes, 2-3 times a day. Activity   If directed, apply heat to the affected area before you exercise. Use the heat source that your health care provider recommends, such as a moist heat pack or a heating pad. ? Place a towel between your skin and the heat source. ? Leave the heat on for 20-30 minutes. ? Remove the heat if your skin turns bright red. This is especially important if you are unable to feel pain,   heat, or cold. You may have a greater risk of getting burned.  Do exercises as told by your health care provider. General instructions  Support your toe joint with proper footwear, shoe padding, or taping as told by your health care provider.  Take over-the-counter and prescription medicines only as told by your health care provider.  Keep all follow-up visits as told by your health care provider. This is important. Contact a health care provider if your symptoms:  Get worse.  Do not improve in 2 weeks. Get help right away if you have:  Severe pain and trouble with walking. Summary  A  bunion is a bump on the base of the big toe that forms when the bones of the big toe joint move out of position.  Bunions can make walking painful.  Treatment depends on the severity of your symptoms.  Support your toe joint with proper footwear, shoe padding, or taping as told by your health care provider. This information is not intended to replace advice given to you by your health care provider. Make sure you discuss any questions you have with your health care provider. Document Released: 02/09/2005 Document Revised: 06/22/2017 Document Reviewed: 06/22/2017 Elsevier Interactive Patient Education  2019 [employer] Inc.  Hallux Rigidus  Hallux rigidus is a type of joint pain or joint disease (arthritis) that affects your big toe (hallux). This condition involves the joint that connects the base of your big toe to the main part of your foot (metatarsophalangeal joint). This condition can cause your big toe to become stiff, painful, and difficult to move. Symptoms may get worse with movement or in cold or damp weather. The condition also gets worse over time. What are the causes? This condition may be caused by having a foot that does not function the way that it should or has an abnormal shape (structural deformity). These foot problems can run in families (be hereditary). This condition can also be caused by:  Injury.  Overuse.  Certain inflammatory diseases, including gout and rheumatoid arthritis. What increases the risk? This condition is more likely to develop in people who:  Have a foot bone (metatarsal) that is longer or higher than normal.  Have a family history of hallux rigidus.  Have previously injured their big toe.  Have feet that do not have a curve (arch) on the inner side of the foot. This may be called flat feet or fallen arches.  Turn their ankles in when they walk (pronation).  Have rheumatoid arthritis or gout.  Have to stoop down often at work. What are the  signs or symptoms? Symptoms of this condition include:  Big toe pain.  Stiffness and difficulty moving the big toe.  Swelling of the toe and surrounding area.  Bone spurs. These are bony growths that can form on the joint of the big toe.  A limp. How is this diagnosed? This condition is diagnosed based on a medical history and physical exam. This may include X-rays. How is this treated? Treatment for this condition includes:  Wearing roomy, comfortable shoes that have a large toe box.  Putting orthotic devices in your shoes.  Pain medicines.  Physical therapy.  Icing the injured area.  Alternate between putting your foot in cold water then warm water. If your condition is severe, treatment may include:  Corticosteroid injections to relieve pain.  Surgery to remove bone spurs, fuse damaged bones together, or replace the entire joint. Follow these instructions at home:  Take over-the-counter and  prescription medicines only as told by your health care provider.  Do not wear high heels or other restrictive footwear. Wear comfortable, supportive shoes that have a large toe box.  Wear orthotics as told by your health care provider, if this applies.  Put your feet in cold water for 30 seconds, then in warm water for 30 seconds. Alternate between the cold and warm water for 5 minutes. Do this several times a day or as told by your health care provider.  If directed, apply ice to the injured area. ? Put ice in a plastic bag. ? Place a towel between your skin and the bag. ? Leave the ice on for 20 minutes, 2-3 times per day.  Do foot exercises as instructed by your health care provider or a physical therapist.  Keep all follow-up visits as told by your health care provider. This is important. Contact a health care provider if:  You notice bone spurs or growths on or around your big toe.  Your pain does not get better or it gets worse.  You have pain while  resting.  You have pain in other parts of your body, such as your back, hip, or knee.  You start to limp. This information is not intended to replace advice given to you by your health care provider. Make sure you discuss any questions you have with your health care provider. Document Released: 02/09/2005 Document Revised: 07/18/2015 Document Reviewed: 10/17/2014 Elsevier Interactive Patient Education  2019 [employer].

## 2018-04-13 NOTE — Progress Notes (Signed)
Subjective:   Patient ID: Annette Stephens, female   DOB: 63 y.o.   MRN: [id number]   HPI Patient presents stating the bunion on my right foot has been bothering me and been present for around 10 years with a long-term family history and recently have started to get more aching in my forefoot and around my big toe joint.  States that is been going on for a number of months and gradually getting worse.  Patient has good digital perfusion and is well oriented x3   Review of Systems  All other systems reviewed and are negative.       Objective:  Physical Exam Vitals signs and nursing note reviewed.  Constitutional:      Appearance: She is well-developed.  Pulmonary:     Effort: Pulmonary effort is normal.  Musculoskeletal: Normal range of motion.  Skin:    General: Skin is warm.  Neurological:     Mental Status: She is alert.     Neurovascular status found to be intact muscle strength is adequate range of motion within normal limits with patient found to have reduced range of motion of the first MPJ right with crepitus of the joint surface and pain with large structural bunion with redness.  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  Long-term surgical correction of bunion hallux limitus deformity will be necessary  X-ray indicates there is bone spur formation first metatarsal right over left with structural bunion deformity and cystic formation  around the first metatarsal head right foot

## 2018-04-27 ENCOUNTER — Ambulatory Visit: Payer: BC Managed Care – PPO

## 2018-04-28 ENCOUNTER — Ambulatory Visit: Payer: BC Managed Care – PPO

## 2018-05-04 ENCOUNTER — Other Ambulatory Visit: Payer: Self-pay

## 2018-05-04 ENCOUNTER — Ambulatory Visit: Payer: BC Managed Care – PPO | Admitting: Podiatry

## 2018-05-04 DIAGNOSIS — M21619 Bunion of unspecified foot: Secondary | ICD-10-CM | POA: Diagnosis not present

## 2018-05-04 DIAGNOSIS — M205X1 Other deformities of toe(s) (acquired), right foot: Secondary | ICD-10-CM

## 2018-05-05 ENCOUNTER — Ambulatory Visit (INDEPENDENT_AMBULATORY_CARE_PROVIDER_SITE_OTHER): Payer: BC Managed Care – PPO | Admitting: Orthopaedic Surgery

## 2018-05-06 NOTE — Progress Notes (Signed)
Subjective:   Patient ID: Annette Stephens, female   DOB: 63 y.o.   MRN: [id number]   HPI Patient presents stating she has had improvement with the injection but knows at one point will probably require surgery   ROS      Objective:  Physical Exam  Neurovascular status intact with diminished inflammation around the first MPJ right over left with pain still present upon deep palpation but improved from previous     Assessment:  Inflammatory capsulitis present with improvement with pain still noted upon deep palpation     Plan:  H&P condition reviewed and recommended wider shoes soaks and if symptoms persist may require surgical intervention in the future or possible future injection if symptoms warrant

## 2018-05-26 ENCOUNTER — Ambulatory Visit: Payer: BC Managed Care – PPO

## 2018-07-12 ENCOUNTER — Ambulatory Visit: Payer: BC Managed Care – PPO

## 2018-09-01 ENCOUNTER — Other Ambulatory Visit: Payer: Self-pay

## 2018-09-01 ENCOUNTER — Ambulatory Visit
Admission: RE | Admit: 2018-09-01 | Discharge: 2018-09-01 | Disposition: A | Discharge status: Home / Self Care | Payer: BC Managed Care – PPO | Source: Ambulatory Visit | Attending: Family Medicine | Admitting: Family Medicine

## 2018-09-01 DIAGNOSIS — Z1231 Encounter for screening mammogram for malignant neoplasm of breast: Secondary | ICD-10-CM

## 2019-01-25 ENCOUNTER — Telehealth: Payer: Self-pay | Admitting: *Deleted

## 2019-01-25 NOTE — Telephone Encounter (Signed)
"  I saw Dr. Paulla Dolly back in probably February.  I was given your card to schedule some surgery then Covid came.  I'm just calling you to today to see how to proceed."  I am returning your call.  You need to see Dr. Paulla Dolly again for another consultation.  Your consent form has to be updated, it's only good for six months.  "How is his schedule looking as far as surgery?"  He can probably get you in before the end of the month.  "I'm looking at scheduling it for January.  I'll call back tomorrow to schedule an appointment."

## 2019-02-01 ENCOUNTER — Ambulatory Visit: Payer: BC Managed Care – PPO | Admitting: Podiatry

## 2019-02-01 ENCOUNTER — Ambulatory Visit (INDEPENDENT_AMBULATORY_CARE_PROVIDER_SITE_OTHER): Payer: BC Managed Care – PPO

## 2019-02-01 ENCOUNTER — Other Ambulatory Visit: Payer: Self-pay

## 2019-02-01 DIAGNOSIS — M2012 Hallux valgus (acquired), left foot: Secondary | ICD-10-CM

## 2019-02-01 DIAGNOSIS — M2011 Hallux valgus (acquired), right foot: Secondary | ICD-10-CM | POA: Diagnosis not present

## 2019-02-01 NOTE — Patient Instructions (Signed)
Bunion  A bunion is a bump on the base of the big toe that forms when the bones of the big toe joint move out of position. Bunions may be small at first, but they often get larger over time. They can make walking painful. What are the causes? A bunion may be caused by:  Wearing narrow or pointed shoes that force the big toe to press against the other toes.  Abnormal foot development that causes the foot to roll inward (pronate).  Changes in the foot that are caused by certain diseases, such as rheumatoid arthritis or polio.  A foot injury. What increases the risk? The following factors may make you more likely to develop this condition:  Wearing shoes that squeeze the toes together.  Having certain diseases, such as: ? Rheumatoid arthritis. ? Polio. ? Cerebral palsy.  Having family members who have bunions.  Being born with a foot deformity, such as flat feet or low arches.  Doing activities that put a lot of pressure on the feet, such as ballet dancing. What are the signs or symptoms? The main symptom of a bunion is a noticeable bump on the big toe. Other symptoms may include:  Pain.  Swelling around the big toe.  Redness and inflammation.  Thick or hardened skin on the big toe or between the toes.  Stiffness or loss of motion in the big toe.  Trouble with walking. How is this diagnosed? A bunion may be diagnosed based on your symptoms, medical history, and activities. You may have tests, such as:  X-rays. These allow your health care provider to check the position of the bones in your foot and look for damage to your joint. They also help your health care provider determine the severity of your bunion and the best way to treat it.  Joint aspiration. In this test, a sample of fluid is removed from the toe joint. This test may be done if you are in a lot of pain. It helps rule out diseases that cause painful swelling of the joints, such as arthritis. How is this  treated? Treatment depends on the severity of your symptoms. The goal of treatment is to relieve symptoms and prevent the bunion from getting worse. Your health care provider may recommend:  Wearing shoes that have a wide toe box.  Using bunion pads to cushion the affected area.  Taping your toes together to keep them in a normal position.  Placing a device inside your shoe (orthotics) to help reduce pressure on your toe joint.  Taking medicine to ease pain, inflammation, and swelling.  Applying heat or ice to the affected area.  Doing stretching exercises.  Surgery to remove scar tissue and move the toes back into their normal position. This treatment is rare. Follow these instructions at home: Managing pain, stiffness, and swelling   If directed, put ice on the painful area: ? Put ice in a plastic bag. ? Place a towel between your skin and the bag. ? Leave the ice on for 20 minutes, 2-3 times a day. Activity   If directed, apply heat to the affected area before you exercise. Use the heat source that your health care provider recommends, such as a moist heat pack or a heating pad. ? Place a towel between your skin and the heat source. ? Leave the heat on for 20-30 minutes. ? Remove the heat if your skin turns bright red. This is especially important if you are unable to feel pain,   heat, or cold. You may have a greater risk of getting burned.  Do exercises as told by your health care provider. General instructions  Support your toe joint with proper footwear, shoe padding, or taping as told by your health care provider.  Take over-the-counter and prescription medicines only as told by your health care provider.  Keep all follow-up visits as told by your health care provider. This is important. Contact a health care provider if your symptoms:  Get worse.  Do not improve in 2 weeks. Get help right away if you have:  Severe pain and trouble with walking. Summary  A  bunion is a bump on the base of the big toe that forms when the bones of the big toe joint move out of position.  Bunions can make walking painful.  Treatment depends on the severity of your symptoms.  Support your toe joint with proper footwear, shoe padding, or taping as told by your health care provider. This information is not intended to replace advice given to you by your health care provider. Make sure you discuss any questions you have with your health care provider. Document Released: 02/09/2005 Document Revised: 08/16/2017 Document Reviewed: 06/22/2017 Elsevier Patient Education  2020 Elsevier Inc.  Pre-Operative Instructions  Congratulations, you have decided to take an important step towards improving your quality of life.  You can be assured that the doctors and staff at [facility] will be with you every step of the way.  Here are some important things you should know:  1. Plan to be at the surgery center/hospital at least 1 (one) hour prior to your scheduled time, unless otherwise directed by the surgical center/hospital staff.  You must have a responsible adult accompany you, remain during the surgery and drive you home.  Make sure you have directions to the surgical center/hospital to ensure you arrive on time. 2. If you are having surgery at [facility] or [facility] hospitals, you will need a copy of your medical history and physical form from your family physician within one month prior to the date of surgery. We will give you a form for your primary physician to complete.  3. We make every effort to accommodate the date you request for surgery.  However, there are times where surgery dates or times have to be moved.  We will contact you as soon as possible if a change in schedule is required.   4. No aspirin/ibuprofen for one week before surgery.  If you are on aspirin, any non-steroidal anti-inflammatory medications (Mobic, Aleve, Ibuprofen) should not be taken seven  (7) days prior to your surgery.  You make take Tylenol for pain prior to surgery.  5. Medications - If you are taking daily heart and blood pressure medications, seizure, reflux, allergy, asthma, anxiety, pain or diabetes medications, make sure you notify the surgery center/hospital before the day of surgery so they can tell you which medications you should take or avoid the day of surgery. 6. No food or drink after midnight the night before surgery unless directed otherwise by surgical center/hospital staff. 7. No alcoholic beverages 24-hours prior to surgery.  No smoking 24-hours prior or 24-hours after surgery. 8. Wear loose pants or shorts. They should be loose enough to fit over bandages, boots, and casts. 9. Don't wear slip-on shoes. Sneakers are preferred. 10. Bring your boot with you to the surgery center/hospital.  Also bring crutches or a walker if your physician has prescribed it for you.    If you do not have this equipment, it will be provided for you after surgery. 11. If you have not been contacted by the surgery center/hospital by the day before your surgery, call to confirm the date and time of your surgery. 12. Leave-time from work may vary depending on the type of surgery you have.  Appropriate arrangements should be made prior to surgery with your employer. 13. Prescriptions will be provided immediately following surgery by your doctor.  Fill these as soon as possible after surgery and take the medication as directed. Pain medications will not be refilled on weekends and must be approved by the doctor. 14. Remove nail polish on the operative foot and avoid getting pedicures prior to surgery. 15. Wash the night before surgery.  The night before surgery wash the foot and leg well with water and the antibacterial soap provided. Be sure to pay special attention to beneath the toenails and in between the toes.  Wash for at least three (3) minutes. Rinse thoroughly with water and dry well with  a towel.  Perform this wash unless told not to do so by your physician.  Enclosed: 1 Ice pack (please put in freezer the night before surgery)   1 Hibiclens skin cleaner   Pre-op instructions  If you have any questions regarding the instructions, please do not hesitate to call our office.  [place]: [address], [place], [place] [address] -- [phone]  [place]: [address] [place], [place] [address] -- [phone]  [place]: [address]  [place], [place] [address] -- [phone]   Website: [email or web address] 

## 2019-02-01 NOTE — Progress Notes (Signed)
Subjective:   Patient ID: Annette Stephens, female   DOB: 63 y.o.   MRN: [id number]   HPI Patient presents stating I am ready to get the bunion fixed on my right foot as it bothers me more and I tried wider shoes and other modalities and my left is somewhat but not to the same degree   ROS      Objective:  Physical Exam  Neurovascular status intact with patient being well oriented x3 with large bunion deformity right with limited motion of the first MPJ and moderate deformity on the left foot.  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  I did explain procedure risk and patient was given consent form going over alternative treatments complications.  After extensive review patient wants surgery signed consent form and is scheduled for outpatient procedure.  I reviewed the fact that total recovery can take 6 months to 1 year and there is no long-term guarantees and patient wants surgical intervention and at this point is going to call to schedule.  I dispensed air fracture walker with all instructions on usage and she will get used to this prior to procedure  X-rays indicate that there is a large structural bunion deformity right over left with mild cystic formation and elevation of the intermetatarsal angle

## 2019-03-30 ENCOUNTER — Telehealth: Payer: Self-pay | Admitting: Podiatry

## 2019-03-30 NOTE — Telephone Encounter (Signed)
I'm calling to schedule a bunion sx with Dr. Charlsie Merles. I look forward to hearing from you at your earliest convenience. Thank you.

## 2019-03-30 NOTE — Telephone Encounter (Signed)
I called the pt back to schedule her sx. Pt requested a date in March, so I scheduled the pt for sx on 03/09. I told the pt she could go ahead and register online with [employer] and that she would get a call a day or two prior to her sx date to let her know what time to arrive. Pt wanted to know how long she would be in the boot for and how long it would be before she could drive. I told her I could send a message to the assistant. Pt stated it was okay that she has it written down at home. I told her she could discuss that with Dr. Charlsie Merles when she came in for her first postop visit. Told pt to call me with any other questions/concerns prior to sx.

## 2019-04-07 ENCOUNTER — Telehealth: Payer: Self-pay | Admitting: Podiatry

## 2019-04-07 NOTE — Telephone Encounter (Signed)
DOS: 05/02/2019  SURGICAL PROCEDURE: Annette Stephens [phone]).  [employer] Policy Effective : 02/24/2019  -  02/22/9998  Member Liability Summary      In-Network   Max Per Benefit Period Year-to-Date Remaining     CoInsurance 20%      Deductible $1250.00  $1250.00     Out-Of-Pocket $4890.00 $4890.00  [facility]     In Network Copay Coinsurance Not Applicable 20%  per  SERVICE YEAR

## 2019-04-19 ENCOUNTER — Telehealth: Payer: Self-pay | Admitting: *Deleted

## 2019-04-19 ENCOUNTER — Telehealth: Payer: Self-pay | Admitting: Podiatry

## 2019-04-19 NOTE — Telephone Encounter (Signed)
I'm scheduled for bunion sx on Tuesday 03/09 with Dr. Charlsie Merles. I've tried calling someone Monday morning and yesterday morning to talk about the timeline post sx. I decided today to try to get to someone through you. I have some questions I don't remember the answers to and I don't see the information in my sx packet. If someone could please call me back today. I want to know my projected timeline in the boot etc.

## 2019-04-19 NOTE — Telephone Encounter (Signed)
Returned patient's phone call to answer questions she had regarding her upcoming surgery with Dr. Charlsie Stephens.  DOS 05/02/19 Annette Stephens RT  Patient stated, "My mother is having spine issues and might need to get surgery done". Patient wanted to know how long it would take her foot to heal because she might need to go out of town to help her mother.  I told patient that everyone heals differently and that I cannot give her an exact time of when her foot would be fully healed.  Patient will know in about 48 hours if her mother needs surgery. I told patient that if her mother needs surgery, to call us back ASAP and let Carley Hammed, Surgery Coordinator, know if she needs to reschedule her surgery.  Patient stated she understood.

## 2019-04-19 NOTE — Telephone Encounter (Signed)
Called pt to let her know I had received her message and I sent it to Ruby, our RN who handles all postop care and questions. Pt wanted to know how long she would be in a boot for, how long it would be until she could drive, and when her postop appointments are. I told her she would be in the boot to her first postop visit and the doctor would discuss with her then how much longer she needed to be in the boot if that was needed. I told her as far as driving, that its a law in [place] that a person cannot drive with a sx boot/shoe on so that it depended on how long it took her to be out of those before she could drive which would probably be a few weeks. I gave the pt her appointment dates and times for her postop appointments and told her she would get a postop sheet at the sx center with that information as well. Pt also wanted to know if she would have stitches. I told her she may have some that dissolvable internal stitches and the external stitches would be removed at her second postop visit. Pt states her mother who is 86 may be having to have unplanned spine sx and depending on how her appointment goes, she may have to postpone her sx so her mom gets the care she needs. Pt stated she would be in touch with me regarding that and will wait for Valery to call her back in regards to her postoperative questions.

## 2019-04-21 ENCOUNTER — Telehealth: Payer: Self-pay | Admitting: Podiatry

## 2019-04-21 NOTE — Telephone Encounter (Signed)
I'm scheduled to have surgery on 03/09. I need to cancel that for right now. I'm having to move my mom to an assisted living facility and get all of that taken care of that I'm unable to have my surgery. I will call back after the next 2 weeks or so to get it rescheduled.   I have cancelled surgery with Aram Beecham at the surgery center, cancelled on Dr. Beverlee Nims schedule in both Epic and in the surgery book. I have also cancelled pt's postop appointments.

## 2019-05-08 ENCOUNTER — Encounter: Payer: BC Managed Care – PPO | Admitting: Podiatry

## 2019-06-05 ENCOUNTER — Encounter: Payer: BC Managed Care – PPO | Admitting: Podiatry

## 2019-08-15 ENCOUNTER — Other Ambulatory Visit: Payer: Self-pay | Admitting: Family Medicine

## 2019-08-15 DIAGNOSIS — Z1231 Encounter for screening mammogram for malignant neoplasm of breast: Secondary | ICD-10-CM

## 2019-09-05 ENCOUNTER — Ambulatory Visit
Admission: RE | Admit: 2019-09-05 | Discharge: 2019-09-05 | Disposition: A | Discharge status: Home / Self Care | Payer: BC Managed Care – PPO | Source: Ambulatory Visit | Attending: Family Medicine | Admitting: Family Medicine

## 2019-09-05 ENCOUNTER — Other Ambulatory Visit: Payer: Self-pay

## 2019-09-05 DIAGNOSIS — Z1231 Encounter for screening mammogram for malignant neoplasm of breast: Secondary | ICD-10-CM

## 2019-10-11 ENCOUNTER — Other Ambulatory Visit: Payer: Self-pay | Admitting: Family Medicine

## 2019-10-11 DIAGNOSIS — M8580 Other specified disorders of bone density and structure, unspecified site: Secondary | ICD-10-CM

## 2019-12-29 ENCOUNTER — Ambulatory Visit
Admission: RE | Admit: 2019-12-29 | Discharge: 2019-12-29 | Disposition: A | Discharge status: Home / Self Care | Payer: BC Managed Care – PPO | Source: Ambulatory Visit | Attending: Family Medicine | Admitting: Family Medicine

## 2019-12-29 ENCOUNTER — Other Ambulatory Visit: Payer: Self-pay

## 2019-12-29 DIAGNOSIS — M8580 Other specified disorders of bone density and structure, unspecified site: Secondary | ICD-10-CM

## 2020-10-15 ENCOUNTER — Other Ambulatory Visit: Payer: Self-pay | Admitting: Family Medicine

## 2020-10-15 DIAGNOSIS — Z1231 Encounter for screening mammogram for malignant neoplasm of breast: Secondary | ICD-10-CM

## 2020-10-16 ENCOUNTER — Ambulatory Visit
Admission: RE | Admit: 2020-10-16 | Discharge: 2020-10-16 | Disposition: A | Discharge status: Home / Self Care | Payer: Self-pay | Source: Ambulatory Visit | Attending: Family Medicine | Admitting: Family Medicine

## 2020-10-16 ENCOUNTER — Other Ambulatory Visit: Payer: Self-pay

## 2020-10-16 DIAGNOSIS — Z1231 Encounter for screening mammogram for malignant neoplasm of breast: Secondary | ICD-10-CM

## 2020-10-18 ENCOUNTER — Ambulatory Visit: Payer: BC Managed Care – PPO

## 2021-09-19 ENCOUNTER — Other Ambulatory Visit: Payer: Self-pay | Admitting: Family Medicine

## 2021-09-19 DIAGNOSIS — Z1231 Encounter for screening mammogram for malignant neoplasm of breast: Secondary | ICD-10-CM

## 2021-10-17 ENCOUNTER — Ambulatory Visit
Admission: RE | Admit: 2021-10-17 | Discharge: 2021-10-17 | Disposition: A | Discharge status: Home / Self Care | Payer: Medicare PPO | Source: Ambulatory Visit | Attending: Family Medicine | Admitting: Family Medicine

## 2021-10-17 DIAGNOSIS — Z1231 Encounter for screening mammogram for malignant neoplasm of breast: Secondary | ICD-10-CM

## 2021-10-29 ENCOUNTER — Other Ambulatory Visit: Payer: Self-pay | Admitting: Family Medicine

## 2021-10-29 DIAGNOSIS — Z780 Asymptomatic menopausal state: Secondary | ICD-10-CM

## 2021-12-29 ENCOUNTER — Other Ambulatory Visit: Payer: Medicare PPO

## 2022-04-09 ENCOUNTER — Ambulatory Visit
Admission: RE | Admit: 2022-04-09 | Discharge: 2022-04-09 | Disposition: A | Discharge status: Home / Self Care | Payer: Medicare PPO | Source: Ambulatory Visit | Attending: Family Medicine | Admitting: Family Medicine

## 2022-04-09 DIAGNOSIS — Z780 Asymptomatic menopausal state: Secondary | ICD-10-CM

## 2022-04-15 ENCOUNTER — Ambulatory Visit: Payer: Medicare PPO | Admitting: Podiatry

## 2022-04-15 DIAGNOSIS — M21619 Bunion of unspecified foot: Secondary | ICD-10-CM | POA: Diagnosis not present

## 2022-04-15 NOTE — Progress Notes (Signed)
Subjective:   Patient ID: Annette Stephens, female   DOB: 67 y.o.   MRN: [id number]   HPI Patient presents with bunion deformity right stated she was supposed to have surgery a number years ago but had to cancel due to other issues and now wants to review again and have surgery.  Patient does not smoke no change in medical history and likes to be active   Review of Systems  All other systems reviewed and are negative.       Objective:  Physical Exam Vitals and nursing note reviewed.  Constitutional:      Appearance: She is well-developed.  Pulmonary:     Effort: Pulmonary effort is normal.  Musculoskeletal:        General: Normal range of motion.  Skin:    General: Skin is warm.  Neurological:     Mental Status: She is alert.     Neurovascular status intact muscle strength adequate range of motion within normal limits with patient found to have large structural bunion deformity right red and painful when pressed mild on the left and states that she has tried wider shoes she has tried padding the area and soaks without relief of symptoms     Assessment:  Chronic structural bunion deformity right with elevation of the ankle with bone formation and also mild to moderate hallux limitus     Plan:  H&P x-ray reviewed condition discussed at great length.  I recommended distal osteotomy explained we may not be able to get full correction but I am optimistic that this will make a difference for her in a significant way with removal of dorsal spurring.  At this point I allowed her to read and then signed consent form going over all alternative treatments complications patient scheduled for outpatient surgery has her boot from previously and understands total recovery can take approximately 6 months with no guarantee as far as long-term results.  Scheduled outpatient surgery  X-rays indicate elevation of the intermetatarsal angle of at least 15 degrees with moderate cystic formation  medial side and dorsal spurring first metatarsal head

## 2022-04-22 ENCOUNTER — Ambulatory Visit: Payer: Medicare PPO | Admitting: Orthopaedic Surgery

## 2022-04-22 ENCOUNTER — Encounter: Payer: Self-pay | Admitting: Orthopaedic Surgery

## 2022-04-22 DIAGNOSIS — M79642 Pain in left hand: Secondary | ICD-10-CM | POA: Diagnosis not present

## 2022-04-22 MED ORDER — METHYLPREDNISOLONE ACETATE 40 MG/ML IJ SUSP
40.0000 mg | INTRAMUSCULAR | Status: AC | PRN
  Administered 2022-04-22: 40 mg

## 2022-04-22 MED ORDER — LIDOCAINE HCL 1 % IJ SOLN
1.0000 mL | INTRAMUSCULAR | Status: AC | PRN
  Administered 2022-04-22: 1 mL

## 2022-04-22 NOTE — Progress Notes (Signed)
The patient is a very pleasant 67 year old right-hand-dominant female that we have seen in the past.  She comes in with pain around the base of her left thumb with no known injury.  She does get down her exercise mat and when she is performing things like yoga she does get pain in the base of the thumbs where she points to.  She says is not as bad as it was when she made the appointment and she denies any numbness and tingling but she still having problems with decreased pain and strength in this area especially with fine motor skills.  On exam she does have a positive grind test at the [facility] joint area of the base of the left thumb.  There is no triggering.  Her Wynn Maudlin test is negative and the remainder of her hand exam is normal.  She has good grip and pinch strength.  Her signs and symptoms are consistent with [facility] joint arthritis.  I did offer steroid injection over the [facility] joint and she agreed to this and tolerated it well.  If this does not get better she will let us know.  My next step would be considering outpatient hand therapy and even another injection but under ultrasound guidance in this area.  All questions and concerns were addressed and answered.       Procedure Note  Patient: Solangie Ganley             Date of Birth: 06-Feb-1956           MRN: [id number]             Visit Date: 04/22/2022  Procedures: Visit Diagnoses:  1. Pain in left hand     Hand/UE Inj: L thumb [facility] for osteoarthritis on 04/22/2022 11:38 AM Medications: 1 mL lidocaine 1 %; 40 mg methylPREDNISolone acetate 40 MG/ML

## 2022-05-04 ENCOUNTER — Telehealth: Payer: Self-pay | Admitting: Urology

## 2022-05-04 NOTE — Telephone Encounter (Signed)
DOS - 05/26/22  [place] BUNIONECTOMY RIGHT --- [phone]  [facility]  PER [place] WEBSITE FOR CPT CODE 29562 HAS BEEN APPROVED, AUTH # [id number], GOOOD FROM 05/26/22 - 07/26/22.  TRACKING ID # [id number]

## 2022-05-25 MED ORDER — ONDANSETRON HCL 4 MG PO TABS: 4.0000 mg | ORAL_TABLET | Freq: Three times a day (TID) | ORAL | 0 refills | Status: DC | PRN

## 2022-05-25 MED ORDER — OXYCODONE-ACETAMINOPHEN 10-325 MG PO TABS: 1.0000 | ORAL_TABLET | ORAL | 0 refills | Status: DC | PRN

## 2022-05-25 NOTE — Addendum Note (Signed)
Addended by: Wallene Huh on: 05/25/2022 01:44 PM   Modules accepted: Orders

## 2022-05-26 ENCOUNTER — Encounter: Payer: Self-pay | Admitting: Podiatry

## 2022-05-26 DIAGNOSIS — M2011 Hallux valgus (acquired), right foot: Secondary | ICD-10-CM | POA: Diagnosis not present

## 2022-06-01 ENCOUNTER — Ambulatory Visit (INDEPENDENT_AMBULATORY_CARE_PROVIDER_SITE_OTHER): Payer: Medicare PPO

## 2022-06-01 ENCOUNTER — Encounter: Payer: Self-pay | Admitting: Podiatry

## 2022-06-01 ENCOUNTER — Ambulatory Visit: Payer: Medicare PPO

## 2022-06-01 ENCOUNTER — Ambulatory Visit (INDEPENDENT_AMBULATORY_CARE_PROVIDER_SITE_OTHER): Payer: Medicare PPO | Admitting: Podiatry

## 2022-06-01 DIAGNOSIS — M21619 Bunion of unspecified foot: Secondary | ICD-10-CM

## 2022-06-01 DIAGNOSIS — M21611 Bunion of right foot: Secondary | ICD-10-CM

## 2022-06-02 NOTE — Progress Notes (Signed)
Subjective:   Patient ID: Annette Stephens, female   DOB: 67 y.o.   MRN: [id number]   HPI Patient states doing very well with surgery has not bared any weight on the forefoot due to the fracture that occurred   ROS      Objective:  Physical Exam  Neurovascular status intact negative Denna Haggard' sign noted wound edges coapted well hallux in rectus position no crepitus upon gentle range of motion     Assessment:  Clinically appears to be doing very well after having osteotomy surgery first metatarsal that did unfortunately crack intraoperatively and was repaired with fixation     Plan:  H&P x-rays reviewed and at this point I am satisfied with the position of the metatarsal and I advised this patient on the continuation of immobilization and no bearing weight on this portion of the foot with continued wedge shoe usage.  Reappoint 2 weeks and we will reevaluate  X-rays indicate fixation is holding it appears the osteotomy is stable there is possibly some plantarflexion of the plantar segment but I think overall the position looks very good

## 2022-06-03 ENCOUNTER — Telehealth: Payer: Self-pay | Admitting: Podiatry

## 2022-06-03 NOTE — Telephone Encounter (Signed)
Patient called and stated she had sx 05/26/2022, she states she is wearing a wrap and a sx shoe on her right foot. She states her foot is swelling and she is having some discomfort around the sx site. Patient wanted to know if this is normal

## 2022-06-04 ENCOUNTER — Other Ambulatory Visit: Payer: Self-pay | Admitting: Podiatry

## 2022-06-04 ENCOUNTER — Encounter: Payer: Self-pay | Admitting: Podiatry

## 2022-06-04 MED ORDER — DOXYCYCLINE HYCLATE 100 MG PO TABS: 100.0000 mg | ORAL_TABLET | Freq: Two times a day (BID) | ORAL | 1 refills | Status: DC

## 2022-06-04 NOTE — Telephone Encounter (Signed)
Called her in antibiotic and let amanda know

## 2022-06-04 NOTE — Telephone Encounter (Signed)
Patient is calling back about her foot. States she left a message on nurse line. I did let her know that a message was sent to provider. Pt had sx on 4/2. States its more red and swollen but she is not in pain and it doesn't seem to have a fever in her foot. She is concerned and states that she will upload a picture of her foot and send it in [place] shortly.  Please advise.

## 2022-06-04 NOTE — Telephone Encounter (Signed)
Patient is requesting a call back about her right foot that is swelling, she is concern and doesn't want to get an infection.  Please advise

## 2022-06-11 ENCOUNTER — Ambulatory Visit (INDEPENDENT_AMBULATORY_CARE_PROVIDER_SITE_OTHER): Payer: Medicare PPO

## 2022-06-11 ENCOUNTER — Ambulatory Visit (INDEPENDENT_AMBULATORY_CARE_PROVIDER_SITE_OTHER): Payer: Medicare PPO | Admitting: Podiatry

## 2022-06-11 ENCOUNTER — Encounter: Payer: Self-pay | Admitting: Podiatry

## 2022-06-11 DIAGNOSIS — M21619 Bunion of unspecified foot: Secondary | ICD-10-CM | POA: Diagnosis not present

## 2022-06-11 DIAGNOSIS — M21611 Bunion of right foot: Secondary | ICD-10-CM | POA: Diagnosis not present

## 2022-06-11 NOTE — Progress Notes (Signed)
Subjective:   Patient ID: Annette Stephens, female   DOB: 67 y.o.   MRN: [id number]   HPI Patient presents stating it seems to be in proving still mildly inflamed part better than it was and I am still wearing the wedge shoe   ROS      Objective:  Physical Exam  Neurovascular status intact negative Denna Haggard' sign was noted she has a little bit of discomfort in the back of the knee but I think it is more due to strain from wearing the wedge shoe with negative issues as far as the calf muscle goes.  Foot itself looks good with incision site healing well no erythema with mild edema which is consistent for this.  Postop good motion no crepitus of the joint     Assessment:  Doing well post osteotomy right first that did crack at the time of surgery due to soft bone cyst that appears to be stable and healing well     Plan:  H&P x-ray reviewed and I went ahead today and I am going to put her into her walking boot and gradually increase activity over the next couple weeks with the boot.  Patient is coming along good if any issues were to occur let us know and will be seen back in 2 weeks  X-rays indicate good stability from the crack in the osteotomy no indications of pathology from that standpoint

## 2022-06-15 ENCOUNTER — Encounter: Payer: Medicare PPO | Admitting: Podiatry

## 2022-06-25 ENCOUNTER — Encounter: Payer: Self-pay | Admitting: Podiatry

## 2022-06-25 ENCOUNTER — Ambulatory Visit (INDEPENDENT_AMBULATORY_CARE_PROVIDER_SITE_OTHER): Payer: Medicare PPO | Admitting: Podiatry

## 2022-06-25 ENCOUNTER — Ambulatory Visit (INDEPENDENT_AMBULATORY_CARE_PROVIDER_SITE_OTHER): Payer: Medicare PPO

## 2022-06-25 DIAGNOSIS — M21611 Bunion of right foot: Secondary | ICD-10-CM | POA: Diagnosis not present

## 2022-06-25 DIAGNOSIS — M21619 Bunion of unspecified foot: Secondary | ICD-10-CM

## 2022-06-25 NOTE — Progress Notes (Signed)
Subjective:   Patient ID: Annette Stephens, female   DOB: 67 y.o.   MRN: [id number]   HPI Patient states overall doing pretty well concerned about some tightness in the foot and tingling but is bearing weight full in the boot   ROS      Objective:  Physical Exam  Neurovascular status intact negative Denna Haggard' sign noted wound edges coapted well hallux in rectus position right with no crepitus of the joint adequate motion     Assessment:  Overall doing well with right foot mild swelling no pathology other than that noted currently normal swelling for this.  Postop     Plan:  X-ray taken did not see any signs of movement everything appears stable at this time.  Confirmed the patient patient will slowly return to surgical shoe and then tennis shoe in 2 weeks ankle compression stocking dispensed range of motion exercises commence reappoint to recheck 4 weeks or earlier if needed  X-rays indicate osteotomy is healing well fixation in place joint congruence no indications of pathology currently from fracture that occurred at time of surgery

## 2022-07-22 ENCOUNTER — Ambulatory Visit (INDEPENDENT_AMBULATORY_CARE_PROVIDER_SITE_OTHER): Payer: Medicare PPO

## 2022-07-22 ENCOUNTER — Encounter: Payer: Self-pay | Admitting: Podiatry

## 2022-07-22 ENCOUNTER — Ambulatory Visit (INDEPENDENT_AMBULATORY_CARE_PROVIDER_SITE_OTHER): Payer: Medicare PPO | Admitting: Podiatry

## 2022-07-22 DIAGNOSIS — M21611 Bunion of right foot: Secondary | ICD-10-CM

## 2022-07-22 DIAGNOSIS — M21619 Bunion of unspecified foot: Secondary | ICD-10-CM

## 2022-07-22 MED ORDER — DICLOFENAC SODIUM 75 MG PO TBEC: 75.0000 mg | DELAYED_RELEASE_TABLET | Freq: Two times a day (BID) | ORAL | 2 refills | Status: DC

## 2022-07-22 NOTE — Progress Notes (Signed)
Subjective:   Patient ID: Annette Stephens, female   DOB: 67 y.o.   MRN: [id number]   HPI Patient states overall she is doing pretty well admits that she has been on her feet almost full-time and she is getting ready to go to [facility] tomorrow.  States she gets some pain in her forefoot and some swelling and wanted it checked neurovascular   ROS      Objective:  Physical Exam  There are status intact negative Denna Haggard' sign noted first MPJ appears to be healing well range of motion slightly restricted adequate with no crepitus with discomfort that is more in the lesser MPJs right with moderate swelling component     Assessment:  Inflammatory capsulitis which may be due to increased blood flow with relative healing of the first MPJ right still not complete at approximate 8 weeks after surgery     Plan:  H&P reviewed placing her on diclofenac twice daily gave instructions for continued elevation and that it is difficult for her to be on her foot as she is much as she has full time from morning till night.  I would like her to elevate at least at times and use compression and patient will be seen back 6 weeks earlier if needed  X-rays indicate the osteotomy appears to be stable at its current position despite an intra operative fracture and appears to be healing well with fixation in place and no indications of pathology of the lesser MPJs current

## 2022-09-02 ENCOUNTER — Ambulatory Visit (INDEPENDENT_AMBULATORY_CARE_PROVIDER_SITE_OTHER): Payer: Medicare PPO

## 2022-09-02 ENCOUNTER — Ambulatory Visit (INDEPENDENT_AMBULATORY_CARE_PROVIDER_SITE_OTHER): Payer: Medicare PPO | Admitting: Podiatry

## 2022-09-02 ENCOUNTER — Encounter: Payer: Self-pay | Admitting: Podiatry

## 2022-09-02 DIAGNOSIS — Z9889 Other specified postprocedural states: Secondary | ICD-10-CM

## 2022-09-02 NOTE — Progress Notes (Signed)
Subjective:   Patient ID: Annette Stephens, female   DOB: 67 y.o.   MRN: [id number]   HPI Patient states doing very well with surgery very pleased at this time slight loss of motion that she is concerned about   ROS      Objective:  Physical Exam  Neurovasc status intact negative Denna Haggard' sign is noted with patient found to have good healing of the first metatarsal right fixation in place no indication of movement     Assessment:  Doing well post osteotomy first metatarsal right with good range of motion slightly restricted     Plan:  H&P reviewed motion techniques that I want her to do and the wearing of good shoes.  Patient's discharge will be seen back as needed  X-rays indicate osteotomy is healing well all fixation is in place joint congruence

## 2022-09-15 ENCOUNTER — Other Ambulatory Visit: Payer: Self-pay | Admitting: Family Medicine

## 2022-09-15 DIAGNOSIS — Z1231 Encounter for screening mammogram for malignant neoplasm of breast: Secondary | ICD-10-CM

## 2022-10-12 ENCOUNTER — Ambulatory Visit (INDEPENDENT_AMBULATORY_CARE_PROVIDER_SITE_OTHER): Payer: Medicare PPO

## 2022-10-12 ENCOUNTER — Encounter: Payer: Self-pay | Admitting: Orthopaedic Surgery

## 2022-10-12 ENCOUNTER — Ambulatory Visit: Payer: Medicare PPO | Admitting: Orthopaedic Surgery

## 2022-10-12 DIAGNOSIS — M546 Pain in thoracic spine: Secondary | ICD-10-CM

## 2022-10-12 DIAGNOSIS — M25512 Pain in left shoulder: Secondary | ICD-10-CM

## 2022-10-12 MED ORDER — METHYLPREDNISOLONE ACETATE 40 MG/ML IJ SUSP
40.0000 mg | INTRAMUSCULAR | Status: AC | PRN
  Administered 2022-10-12: 40 mg via INTRAMUSCULAR

## 2022-10-12 MED ORDER — TIZANIDINE HCL 2 MG PO TABS: 2.0000 mg | ORAL_TABLET | Freq: Three times a day (TID) | ORAL | 1 refills | Status: AC | PRN

## 2022-10-12 MED ORDER — DICLOFENAC SODIUM 75 MG PO TBEC: 75.0000 mg | DELAYED_RELEASE_TABLET | Freq: Two times a day (BID) | ORAL | 2 refills | Status: AC | PRN

## 2022-10-12 MED ORDER — LIDOCAINE HCL 1 % IJ SOLN
1.0000 mL | INTRAMUSCULAR | Status: AC | PRN
  Administered 2022-10-12: 1 mL

## 2022-10-12 NOTE — Progress Notes (Signed)
The patient comes in today with chief complaint of parascapular pain and mid back pain.  She points to the thoracic area as a source of her pain it does cause spasming and radiates to her shoulders.  She denies any numbness and tingling in her hands and denies any neck pain.  She denies any injury.  It does affect her sleeping at night.  She has had bunion surgery back in April and noticed that her gait off little bit and that may have affected her.  She does have a prescription for diclofenac at home but has not used it for this.  She says she has taken ibuprofen but that does not always help.  On exam her neck exam was normal.  Her upper extremity exam is normal.  She has trigger point pain along the medial border of the left scapula.  It is reproducible.  2 views of thoracic spine show no acute findings.  I would like to send her to physical therapy for any modalities including even dry needling that could help this area get better.  I did offer trigger point injection and she agreed to this and did tolerate it well.  She has done well with injections in the past in other areas.  I will refill her diclofenac and encouraged her to take this as well as we will send in some Zanaflex to try for spasms.  We will see her back in 6 weeks after course of therapy.  All questions and concerns were addressed and answered.     Procedure Note  Patient: Annette Stephens             Date of Birth: 1956-01-04           MRN: [id number]             Visit Date: 10/12/2022  Procedures: Visit Diagnoses:  1. Pain in thoracic spine   2. Trigger point of left shoulder region     Trigger Point Inj  Date/Time: 10/12/2022 8:31 AM  Performed by: Kathryne Hitch, MD Authorized by: Kathryne Hitch, MD   Indications:  Muscle spasm Total # of Trigger Points:  1 Medications #1:  1 mL lidocaine 1 %; 40 mg methylPREDNISolone acetate 40 MG/ML

## 2022-10-13 ENCOUNTER — Other Ambulatory Visit: Payer: Self-pay

## 2022-10-13 DIAGNOSIS — M546 Pain in thoracic spine: Secondary | ICD-10-CM

## 2022-10-19 ENCOUNTER — Ambulatory Visit
Admission: RE | Admit: 2022-10-19 | Discharge: 2022-10-19 | Disposition: A | Discharge status: Home / Self Care | Payer: Medicare PPO | Source: Ambulatory Visit | Attending: Family Medicine | Admitting: Family Medicine

## 2022-10-19 DIAGNOSIS — Z1231 Encounter for screening mammogram for malignant neoplasm of breast: Secondary | ICD-10-CM

## 2022-10-20 ENCOUNTER — Encounter: Payer: Self-pay | Admitting: Physical Therapy

## 2022-10-20 ENCOUNTER — Other Ambulatory Visit: Payer: Self-pay

## 2022-10-20 ENCOUNTER — Ambulatory Visit: Payer: Medicare PPO | Admitting: Physical Therapy

## 2022-10-20 DIAGNOSIS — R262 Difficulty in walking, not elsewhere classified: Secondary | ICD-10-CM

## 2022-10-20 DIAGNOSIS — M546 Pain in thoracic spine: Secondary | ICD-10-CM | POA: Diagnosis not present

## 2022-10-20 DIAGNOSIS — M6281 Muscle weakness (generalized): Secondary | ICD-10-CM | POA: Diagnosis not present

## 2022-10-20 DIAGNOSIS — R2689 Other abnormalities of gait and mobility: Secondary | ICD-10-CM | POA: Diagnosis not present

## 2022-10-20 NOTE — Therapy (Addendum)
OUTPATIENT PHYSICAL THERAPY THORACOLUMBAR EVALUATION  / DISCHARGE  Referring diagnosis? M54.6 (ICD-10-CM) - Pain in thoracic spine Treatment diagnosis? (if different than referring diagnosis) same as above What was this (referring dx) caused by? [x]  Surgery (Rt foot surgery) []  Fall []  Ongoing issue []  Arthritis [x]  Other: _spasm/strain___________  Laterality: []  Rt []  Lt [x]  Both  Check all possible CPT codes:  *CHOOSE 10 OR LESS*    [x]  97110 (Therapeutic Exercise)  []  92507 (SLP Treatment)  [x]  97112 (Neuro Re-ed)   []  92526 (Swallowing Treatment)   [x]  97116 (Gait Training)   []  [id number] (Cognitive Training, 1st 15 minutes) [x]  97140 (Manual Therapy)   []  97130 (Cognitive Training, each add'l 15 minutes)  []  97164 (Re-evaluation)                              []  Other, List CPT Code ____________  [x]  97530 (Therapeutic Activities)     []  97535 (Self Care)   []  All codes above (97110 - 97535)  [x]  97012 (Mechanical Traction)  []  97014 (E-stim Unattended)  []  97032 (E-stim manual)  []  97033 (Ionto)  []  97035 (Ultrasound) []  97750 (Physical Performance Training) [x]  91478 (Aquatic Therapy) []  97016 (Vasopneumatic Device) []  [id number] (Paraffin) []  97034 (Contrast Bath) []  97597 (Wound Care 1st 20 sq cm) []  97598 (Wound Care each add'l 20 sq cm) []  97760 (Orthotic Fabrication, Fitting, Training Initial) []  [id number] (Prosthetic Management and Training Initial) []  [id number] (Orthotic or Prosthetic Training/ Modification Subsequent)  Patient Name: Annette Stephens MRN: [id number] DOB:[birth year], 67 y.o., female Today's Date: 10/20/2022  END OF SESSION:  PT End of Session - 10/20/22 1510     Visit Number 1    Number of Visits 6    Date for PT Re-Evaluation 12/15/22    Authorization Type [employer]    PT Start Time 1345    PT Stop Time 1430    PT Time Calculation (min) 45 min    Activity Tolerance Patient tolerated treatment well    Behavior During Therapy WFL for tasks  assessed/performed             Past Medical History:  Diagnosis Date   Arthritis    right scaplula   Past Surgical History:  Procedure Laterality Date   DILATION AND CURETTAGE OF UTERUS  1984   TUBAL LIGATION  1989   Patient Active Problem List   Diagnosis Date Noted   Mucous cyst of finger 07/15/2016    PCP: Richmond Campbell., PA-C   REFERRING PROVIDER: Kathryne Hitch, MD  REFERRING DIAG: M54.6 (ICD-10-CM) - Pain in thoracic spine  Rationale for Evaluation and Treatment: Rehabilitation  THERAPY DIAG:  Pain in thoracic spine  Muscle weakness (generalized)  Difficulty in walking, not elsewhere classified  ONSET DATE: Had bunion surgery 09/25/22 and gait has been off since, Back spasms started July 2024  SUBJECTIVE:  SUBJECTIVE STATEMENT: Had bunion surgery 09/25/22 and gait has been off since, she also was having Back spasms that started July 2024. She did see Dr. Magnus Ivan and had injection and was prescribed meds which has seemed to help and not having the spasms since. Her chief complaint today is stiffness in her big toe and pain in her foot. She denies any N/T.   PERTINENT HISTORY:  Bunion surgery 2024, OA,   PAIN:  NPRS scale: 0/10 upon arrival today Pain location:mid back across Pain description: spasm, achey Aggravating factors: not sure, walking does affect her Lt toe and foot  Relieving factors: meds, injection   PRECAUTIONS: None  RED FLAGS: None   WEIGHT BEARING RESTRICTIONS: No  FALLS:  Has patient fallen in last 6 months? No   OCCUPATION: retired  PLOF: Independent  PATIENT GOALS: walk, gain mobility back in toes, keep the back pain at bay.  NEXT MD VISIT: October 2nd  OBJECTIVE:   DIAGNOSTIC FINDINGS:  "2 views of thoracic spine show no  acute findings. " 10/13/22  PATIENT SURVEYS:  Eval: FOTO 63% funcitonal intake, goal is 75%  SCREENING FOR RED FLAGS: Bowel or bladder incontinence: No  COGNITION: Overall cognitive status: Within functional limits for tasks assessed     SENSATION: WFL  MUSCLE LENGTH: Hamstrings: minimally tight bilat   POSTURE: No Significant postural limitations except Rt shoulder lower than left  PALPATION: Denies tenderness to palpation today in her back  LUMBAR ROM: WNL grossly at eval   LOWER EXTREMITY ROM:   WNL grossly at eval except moderate limitation in Rt great toe extension and severe limitation in great toe flexion   LOWER EXTREMITY MMT:  WNL grossly at eval, but does have some functional core weakness noted.     LUMBAR SPECIAL TESTS:  Straight leg raise test: Negative, Quadrant test: Negative, and FABER test: Negative  FUNCTIONAL TESTS:  SLS 15 sec bilat but increased sway of Right  GAIT: Eval Comments: gait independent but she does report she has to walk on the outside of her foot some and is limited by distance she can walk due to pain and soreness on medial side of foot and big toe stiffness  TODAY'S TREATMENT:  Eval HEP creation and review with demonstration and trial set preformed, see below for details Manual therapy for Rt great toe PROM flexion, extension, A-P mobs, and distraction mobs   PATIENT EDUCATION: Education details: HEP, PT plan of care Person educated: Patient Education method: Explanation, Demonstration, Verbal cues, and Handouts Education comprehension: verbalized understanding and needs further education   HOME EXERCISE PROGRAM: Access Code: [email or web address] URL: [email or web address] Date: 10/20/2022 Prepared by: Ivery Quale  Exercises - Seated Toe Flexion Extension PROM  - 3 x daily - 6 x weekly - 3 sets - 10 reps - Towel Scrunches  - 2 x daily - 6 x weekly - 3 sets - 10 reps - Great Toe Flexion with Resistance  - 2 x daily  - 6 x weekly - 3 sets - 10 reps - Single Leg Stance  - 2 x daily - 6 x weekly - 1 sets - 5 reps - 20 sec hold - Tandem Walking  - 2 x daily - 6 x weekly - 3 sets - 10 reps - Curl Up with Reach  - 1 x daily - 6 x weekly - 2 sets - 10 reps - 5 sec hold - Diagonal Curl Up with Reach  - 1 x daily - 6 x weekly - 2  sets - 10 reps - 5 sec hold - Supine Bilateral Isometric Hip Flexion  - 1 x daily - 6 x weekly - 2 sets - 10 reps - 5 sec hold  ASSESSMENT:  CLINICAL IMPRESSION: Patient referred to PT for thoracic pain and spasms. This appears to have resolved after she saw MD and had injection and was prescibed meds. She does have some core weakness that PT could help her with and her chief complaint is Rt foot pain and great toe stiffness after bunion surgery that is affecting her gait and walking tolerance. Patient will benefit from skilled PT to address below impairments, limitations and improve overall function.  OBJECTIVE IMPAIRMENTS: decreased activity tolerance, decreased shoulder mobility, decreased ROM, decreased strength, impaired flexibility, impaired UE use, postural dysfunction, and pain.  ACTIVITY LIMITATIONS: walking, lifting, cleaning PERSONAL FACTORS:  bunion surgery, OA also affecting patient's functional outcome.  REHAB POTENTIAL: Good  CLINICAL DECISION MAKING: Stable/uncomplicated  EVALUATION COMPLEXITY: Low    GOALS: Short term PT Goals Target date: 11/17/2022   Pt will be I and compliant with HEP. Baseline:  Goal status: New Pt will decrease pain by 25% overall Baseline: Goal status: New  Long term PT goals Target date:12/15/2022    Pt will improve Rt great toe AROM to [facility] to improve functional gait Baseline: Goal status: New Pt will improve FOTO to at least % functional to show improved function Baseline: Goal status: New Pt will reduce pain to overall less than 2/10 with usual activity and walking Baseline: Goal status: New  PLAN: PT FREQUENCY: 1 times  per week   PT DURATION: 8 weeks  PLANNED INTERVENTIONS (unless contraindicated): aquatic PT, Canalith repositioning, cryotherapy, Electrical stimulation, Iontophoresis with 4 mg/ml dexamethasome, Moist heat, traction, Ultrasound, gait training, Therapeutic exercise, balance training, neuromuscular re-education, patient/family education, prosthetic training, manual techniques, passive ROM, dry needling, taping, vasopnuematic device, vestibular, spinal manipulations, joint manipulations  PLAN FOR NEXT SESSION: she will trial HEP for now and follow up with PT PRN.  April Manson, PT,DPT 10/20/2022, 3:14 PM    PHYSICAL THERAPY DISCHARGE SUMMARY  Visits from Start of Care: 1  Current functional level related to goals / functional outcomes: See note   Remaining deficits: See note   Education / Equipment: HEP  Patient goals were not met. Patient is being discharged due to not returning since the last visit.  Chyrel Masson, PT, DPT, OCS, ATC 11/27/22  10:36 AM

## 2022-11-25 ENCOUNTER — Encounter: Payer: Self-pay | Admitting: Orthopaedic Surgery

## 2022-11-25 ENCOUNTER — Ambulatory Visit: Payer: Medicare PPO | Admitting: Orthopaedic Surgery

## 2022-11-25 DIAGNOSIS — M546 Pain in thoracic spine: Secondary | ICD-10-CM | POA: Diagnosis not present

## 2022-11-25 DIAGNOSIS — M25512 Pain in left shoulder: Secondary | ICD-10-CM

## 2022-11-25 NOTE — Progress Notes (Signed)
The patient comes in today for follow-up after having a trigger point injection in her parascapular area of the left shoulder.  She had also been dealing with mid to low back spasms.  She has gotten much better overall and had a single physical therapy appointment.  She then left town for 2 weeks due to a grandbaby being born.  She said physical therapy said she can come back up there if things or not getting better but overall she is doing very well and she said the trigger point injection and therapy were very helpful.  They did give her home exercises to try.  On exam her trigger point pain is gone on that left side.  Overall she looks great.  From my standpoint follow-up can be as needed unless things worsen.  She can also call upstairs for resuming physical therapy if she needs to.

## 2023-12-20 ENCOUNTER — Other Ambulatory Visit: Payer: Self-pay | Admitting: Family Medicine

## 2023-12-20 DIAGNOSIS — Z1231 Encounter for screening mammogram for malignant neoplasm of breast: Secondary | ICD-10-CM

## 2024-01-11 ENCOUNTER — Ambulatory Visit
Admission: RE | Admit: 2024-01-11 | Discharge: 2024-01-11 | Disposition: A | Source: Ambulatory Visit | Attending: Family Medicine | Admitting: Family Medicine

## 2024-01-11 DIAGNOSIS — Z1231 Encounter for screening mammogram for malignant neoplasm of breast: Secondary | ICD-10-CM
